# Patient Record
Sex: Female | Born: 1960 | Race: White | Hispanic: No | Marital: Married | Smoking: Never smoker
Health system: Southern US, Community
[De-identification: ages and names within clinical notes are randomized; demographics above are authoritative.]

## PROBLEM LIST (undated history)

## (undated) DIAGNOSIS — R609 Edema, unspecified: Secondary | ICD-10-CM

## (undated) DIAGNOSIS — Z8669 Personal history of other diseases of the nervous system and sense organs: Secondary | ICD-10-CM

## (undated) DIAGNOSIS — E559 Vitamin D deficiency, unspecified: Secondary | ICD-10-CM

## (undated) DIAGNOSIS — E538 Deficiency of other specified B group vitamins: Secondary | ICD-10-CM

## (undated) DIAGNOSIS — R238 Other skin changes: Secondary | ICD-10-CM

## (undated) DIAGNOSIS — M1990 Unspecified osteoarthritis, unspecified site: Secondary | ICD-10-CM

## (undated) DIAGNOSIS — Z9289 Personal history of other medical treatment: Secondary | ICD-10-CM

## (undated) DIAGNOSIS — K219 Gastro-esophageal reflux disease without esophagitis: Secondary | ICD-10-CM

## (undated) DIAGNOSIS — Z8709 Personal history of other diseases of the respiratory system: Secondary | ICD-10-CM

## (undated) DIAGNOSIS — E119 Type 2 diabetes mellitus without complications: Secondary | ICD-10-CM

## (undated) DIAGNOSIS — F419 Anxiety disorder, unspecified: Secondary | ICD-10-CM

## (undated) DIAGNOSIS — J189 Pneumonia, unspecified organism: Secondary | ICD-10-CM

## (undated) DIAGNOSIS — Z9889 Other specified postprocedural states: Secondary | ICD-10-CM

## (undated) DIAGNOSIS — G4700 Insomnia, unspecified: Secondary | ICD-10-CM

## (undated) DIAGNOSIS — E785 Hyperlipidemia, unspecified: Secondary | ICD-10-CM

## (undated) DIAGNOSIS — R112 Nausea with vomiting, unspecified: Secondary | ICD-10-CM

## (undated) DIAGNOSIS — I10 Essential (primary) hypertension: Secondary | ICD-10-CM

## (undated) DIAGNOSIS — R600 Localized edema: Secondary | ICD-10-CM

## (undated) DIAGNOSIS — K589 Irritable bowel syndrome without diarrhea: Secondary | ICD-10-CM

## (undated) DIAGNOSIS — R233 Spontaneous ecchymoses: Secondary | ICD-10-CM

## (undated) DIAGNOSIS — E531 Pyridoxine deficiency: Secondary | ICD-10-CM

## (undated) DIAGNOSIS — I219 Acute myocardial infarction, unspecified: Secondary | ICD-10-CM

## (undated) DIAGNOSIS — Z87440 Personal history of urinary (tract) infections: Secondary | ICD-10-CM

## (undated) DIAGNOSIS — I2510 Atherosclerotic heart disease of native coronary artery without angina pectoris: Secondary | ICD-10-CM

## (undated) DIAGNOSIS — J45909 Unspecified asthma, uncomplicated: Secondary | ICD-10-CM

## (undated) DIAGNOSIS — G8929 Other chronic pain: Secondary | ICD-10-CM

## (undated) DIAGNOSIS — M542 Cervicalgia: Secondary | ICD-10-CM

## (undated) DIAGNOSIS — R002 Palpitations: Secondary | ICD-10-CM

## (undated) DIAGNOSIS — M549 Dorsalgia, unspecified: Secondary | ICD-10-CM

## (undated) HISTORY — PX: ESOPHAGOGASTRODUODENOSCOPY: SHX1529

## (undated) HISTORY — PX: CHOLECYSTECTOMY: SHX55

## (undated) HISTORY — PX: PARTIAL HYSTERECTOMY: SHX80

## (undated) HISTORY — PX: DIAGNOSTIC LAPAROSCOPY: SUR761

## (undated) HISTORY — PX: NECK SURGERY: SHX720

## (undated) HISTORY — PX: DILATION AND CURETTAGE OF UTERUS: SHX78

## (undated) HISTORY — PX: OTHER SURGICAL HISTORY: SHX169

---

## 2001-05-02 ENCOUNTER — Encounter (INDEPENDENT_AMBULATORY_CARE_PROVIDER_SITE_OTHER): Payer: Self-pay

## 2001-05-02 ENCOUNTER — Encounter: Payer: Self-pay | Admitting: General Surgery

## 2001-05-02 ENCOUNTER — Encounter: Admission: RE | Admit: 2001-05-02 | Discharge: 2001-05-02 | Payer: Self-pay | Admitting: General Surgery

## 2001-06-25 DIAGNOSIS — I219 Acute myocardial infarction, unspecified: Secondary | ICD-10-CM

## 2001-06-25 HISTORY — DX: Acute myocardial infarction, unspecified: I21.9

## 2001-06-25 HISTORY — PX: CARDIAC CATHETERIZATION: SHX172

## 2001-12-22 ENCOUNTER — Encounter: Payer: Self-pay | Admitting: Family Medicine

## 2001-12-22 ENCOUNTER — Encounter: Admission: RE | Admit: 2001-12-22 | Discharge: 2001-12-22 | Payer: Self-pay | Admitting: Family Medicine

## 2003-03-16 ENCOUNTER — Other Ambulatory Visit: Admission: RE | Admit: 2003-03-16 | Discharge: 2003-03-16 | Payer: Self-pay | Admitting: Obstetrics and Gynecology

## 2003-03-23 ENCOUNTER — Encounter: Payer: Self-pay | Admitting: Obstetrics and Gynecology

## 2003-03-23 ENCOUNTER — Encounter: Admission: RE | Admit: 2003-03-23 | Discharge: 2003-03-23 | Payer: Self-pay | Admitting: Obstetrics and Gynecology

## 2003-09-13 ENCOUNTER — Ambulatory Visit (HOSPITAL_BASED_OUTPATIENT_CLINIC_OR_DEPARTMENT_OTHER): Admission: RE | Admit: 2003-09-13 | Discharge: 2003-09-13 | Payer: Self-pay | Admitting: Urology

## 2003-09-13 ENCOUNTER — Ambulatory Visit (HOSPITAL_COMMUNITY): Admission: RE | Admit: 2003-09-13 | Discharge: 2003-09-13 | Payer: Self-pay | Admitting: Urology

## 2004-04-18 ENCOUNTER — Other Ambulatory Visit: Admission: RE | Admit: 2004-04-18 | Discharge: 2004-04-18 | Payer: Self-pay | Admitting: Obstetrics and Gynecology

## 2004-06-09 ENCOUNTER — Observation Stay (HOSPITAL_COMMUNITY): Admission: AD | Admit: 2004-06-09 | Discharge: 2004-06-11 | Payer: Self-pay | Admitting: Obstetrics and Gynecology

## 2004-06-09 ENCOUNTER — Encounter (INDEPENDENT_AMBULATORY_CARE_PROVIDER_SITE_OTHER): Payer: Self-pay | Admitting: *Deleted

## 2004-06-09 ENCOUNTER — Ambulatory Visit (HOSPITAL_COMMUNITY): Admission: RE | Admit: 2004-06-09 | Discharge: 2004-06-09 | Payer: Self-pay | Admitting: Physical Therapy

## 2005-05-03 ENCOUNTER — Other Ambulatory Visit: Admission: RE | Admit: 2005-05-03 | Discharge: 2005-05-03 | Payer: Self-pay | Admitting: Obstetrics and Gynecology

## 2005-05-31 ENCOUNTER — Observation Stay (HOSPITAL_COMMUNITY): Admission: RE | Admit: 2005-05-31 | Discharge: 2005-06-01 | Payer: Self-pay | Admitting: Obstetrics and Gynecology

## 2005-05-31 ENCOUNTER — Encounter (INDEPENDENT_AMBULATORY_CARE_PROVIDER_SITE_OTHER): Payer: Self-pay | Admitting: Specialist

## 2005-09-08 ENCOUNTER — Encounter: Admission: RE | Admit: 2005-09-08 | Discharge: 2005-09-08 | Payer: Self-pay | Admitting: Obstetrics and Gynecology

## 2006-05-06 ENCOUNTER — Ambulatory Visit (HOSPITAL_COMMUNITY): Admission: RE | Admit: 2006-05-06 | Discharge: 2006-05-07 | Payer: Self-pay | Admitting: Neurosurgery

## 2006-05-28 ENCOUNTER — Encounter: Admission: RE | Admit: 2006-05-28 | Discharge: 2006-05-28 | Payer: Self-pay | Admitting: Family Medicine

## 2007-06-09 ENCOUNTER — Encounter: Admission: RE | Admit: 2007-06-09 | Discharge: 2007-06-09 | Payer: Self-pay | Admitting: Family Medicine

## 2008-06-09 ENCOUNTER — Encounter: Admission: RE | Admit: 2008-06-09 | Discharge: 2008-06-09 | Payer: Self-pay | Admitting: Obstetrics and Gynecology

## 2009-06-20 ENCOUNTER — Encounter: Admission: RE | Admit: 2009-06-20 | Discharge: 2009-06-20 | Payer: Self-pay | Admitting: Obstetrics and Gynecology

## 2010-01-02 ENCOUNTER — Inpatient Hospital Stay (HOSPITAL_COMMUNITY)
Admission: RE | Admit: 2010-01-02 | Discharge: 2010-01-02 | Payer: Self-pay | Source: Home / Self Care | Admitting: Neurosurgery

## 2010-02-23 ENCOUNTER — Encounter: Admission: RE | Admit: 2010-02-23 | Discharge: 2010-02-23 | Payer: Self-pay | Admitting: Neurosurgery

## 2010-07-14 ENCOUNTER — Encounter
Admission: RE | Admit: 2010-07-14 | Discharge: 2010-07-14 | Payer: Self-pay | Source: Home / Self Care | Attending: Obstetrics and Gynecology | Admitting: Obstetrics and Gynecology

## 2010-09-10 LAB — CBC
HCT: 43.5 % (ref 36.0–46.0)
MCHC: 34.5 g/dL (ref 30.0–36.0)
MCV: 96.9 fL (ref 78.0–100.0)
WBC: 5 10*3/uL (ref 4.0–10.5)

## 2010-09-10 LAB — DIFFERENTIAL
Basophils Absolute: 0 10*3/uL (ref 0.0–0.1)
Eosinophils Absolute: 0.1 10*3/uL (ref 0.0–0.7)
Eosinophils Relative: 2 % (ref 0–5)
Lymphocytes Relative: 30 % (ref 12–46)
Neutrophils Relative %: 61 % (ref 43–77)

## 2010-09-10 LAB — BASIC METABOLIC PANEL
Calcium: 10.1 mg/dL (ref 8.4–10.5)
Chloride: 104 mEq/L (ref 96–112)
Creatinine, Ser: 0.86 mg/dL (ref 0.4–1.2)
GFR calc Af Amer: >60 mL/min (ref >60)
GFR calc non Af Amer: >60 mL/min (ref >60)
Glucose, Bld: 110 mg/dL — ABNORMAL HIGH (ref 70–99)
Potassium: 5 mEq/L (ref 3.5–5.1)
Sodium: 141 mEq/L (ref 135–145)

## 2010-09-10 LAB — TYPE AND SCREEN

## 2010-11-10 NOTE — Op Note (Signed)
NAMESHUREE, BROSSART               ACCOUNT NO.:  [id number]   MEDICAL RECORD NO.:  [id number]          PATIENT TYPE:  OBV   LOCATION:  9399                          FACILITY:  [facility]   PHYSICIAN:  Juluis Mire, M.D.   DATE OF BIRTH:  May 03, 1961   DATE OF PROCEDURE:  05/31/2005  DATE OF DISCHARGE:                                 OPERATIVE REPORT   PREOPERATIVE DIAGNOSES:  1.  Pelvic endometriosis.  2.  Pelvic adhesions.   POSTOPERATIVE DIAGNOSES:  1.  Pelvic endometriosis.  2.  Pelvic adhesions.   OPERATIVE PROCEDURE:  1.  Lysis of adhesions.  2.  Laparoscopically-assisted vaginal hysterectomy.   SURGEON:  Juluis Mire, M.D.   ASSISTANT:  Zelphia Cairo, M.D.   ESTIMATED BLOOD LOSS:  600 mL.   PACKS AND DRAINS:  None.   INTRAOPERATIVE BLOOD REPLACED:  None.   COMPLICATIONS:  None.   INDICATIONS:  Dictated in the history and physical.   PROCEDURE:  The patient was taken to the OR and placed in the supine  position.  After a satisfactory level of general endotracheal anesthesia  obtained, the patient was placed in the dorsal lithotomy position using the  Allen stirrups.  The abdomen, perineum and vagina were prepped out with  Betadine.  A Hulka tenaculum was put in place.  The bladder was emptied by  in-and-out catheterization.  The patient was then draped in a sterile field.  A subumbilical incision made with a knife, extended through the subcutaneous  tissue.  The fascia was extended sharply and the incision in the fascia  extended laterally.  The peritoneum was identified and entered using blunt  pressure.  The Taut open laparoscopic trocar was put in place and secured.  The laparoscope was introduced.  There was no evidence of injury to adjacent  organs.  A 5 mm trocar was put in place in the suprapubic area under direct  visualization.  The uterus was enlarged.  She did have some cul-de-sac  adhesions that were taken down.  The left and right ovary appeared to  be  normal.  There was some thickening in the left round ligament and left  uterosacral area from endometriosis.  We first went to the right adnexa,  where using the Gyrus bipolar the right utero-ovarian pedicle was  cauterized, incised, the right tube and mesosalpinx was cauterized, incised,  the right round ligament was cauterized, incised, and the right broad  ligament was developed.  Next the left utero-ovarian pedicle was cauterized,  incised, the left tube and mesosalpinx was cauterized, incised, and the left  round ligament was cauterized, incised and extended into the broad ligament.  At this point in time we had good freeing of the adnexa.  The decision was  to go vaginally.  The abdomen was deflated of its carbon dioxide, the  laparoscope was removed.   The Hulka tenaculum was then removed, the patient's legs were repositioned.  A weighted speculum was placed in the vaginal vault, the cervix grasped with  a Jacobs tenaculum and the cul-de-sac was entered sharply.  Both uterosacral  ligaments  were clamped, cut, and suture ligated with 0 Vicryl.  The  reflection of the vaginal mucosa anteriorly was incised.  The bladder was  dissected superiorly.  The paracervical tissue was clamped, cut, and suture  ligated with 0 Vicryl.  The vesicouterine space was entered and a retractor  was put in place, no evidence of bladder injury.  Using the clamp, cut and  tie technique with suture ligatures of 0 Vicryl, the parametrium was  serially separated from the sides of uterus.  The uterus was then flipped  and removed.  We had some brisk bleeding from the left pelvic sidewall.  We  used a figure-of-eight of 0 Vicryl.  It continued to bleed.  The incision  was to go back laparoscopically.  At this point in time the vaginal mucosa  was reapproximated in a vertical fashion with interrupted figure-of-eights  of 0 Vicryl.  The Foley was placed to straight drain with retrieval of an  adequate amount  of clear urine.  A sponge on a sponge stick was placed in  the vaginal vault.   The patient's legs were repositioned.  The weighted speculum had been  removed.  The abdomen was reinflated of its carbon dioxide.  The laparoscope  was reintroduced.  Using suction irrigation, we identified an arterial  pumper along the left vaginal cuff, and it was brought under control with  the Gyrus bipolar.  At this point we had good hemostasis.  There was some  minimal vaginal cuff ooze, and this was brought under control with the  Gyrus.  We did notice a Ray-Tec in the cul-de-sac.  We removed that through  the subumbilical incision.  We reintroduced the laparoscope, visualizing the  vaginal cuff and both ovaries, and we had good hemostasis.  Total blood loss  was 600 mL.  The patient remained stable.  At this point in time the abdomen  was deflated of its carbon dioxide, all trocars removed, subumbilical fascia  closed with two figure-of-eights of 0 Vicryl, skin with interrupted  subcuticulars of 4-0 Vicryl, the suprapubic incision closed with Dermabond.  The sponge on a sponge stick was removed from the vaginal vault.  Urine  output remained clear and adequate.  The patient was taken out of the dorsal  lithotomy position and once alert extubated and transferred to the recovery  room in good condition.  Sponge, instrument and needle count reported as  correct by the circulating nurse x2.      Juluis Mire, M.D.  Electronically Signed     JSM/MEDQ  D:  05/31/2005  T:  05/31/2005  Job:  [id number]

## 2010-11-10 NOTE — H&P (Signed)
Michelle Archer, Michelle Archer               ACCOUNT NO.:  [id number]   MEDICAL RECORD NO.:  [id number]          PATIENT TYPE:  AMB   LOCATION:  SDC                           FACILITY:  [facility]   PHYSICIAN:  Juluis Mire, M.D.   DATE OF BIRTH:  Jun 22, 1961   DATE OF ADMISSION:  05/31/2005  DATE OF DISCHARGE:                                HISTORY & PHYSICAL   Patient is a 50 year old gravida 2, para 2-0-0-4 female presents for  laparoscopic-assisted vaginal hysterectomy.  Husband has had a prior  vasectomy.  Cycles are regular.  She has three days of flow, one day being  extremely heavy with increasing pain and discomfort.  She is using Darvocet  as needed for pain.  During intercourse she has pain during deep  penetration.  She had a previous diagnostic laparoscopy in 2005 with  findings of some pelvic adhesions and evidence of endometriosis.  The pain  with intercourse is becoming extremely limiting.  She has had urologic  procedures including TVT.  Dr. Retta Diones has evaluated her for potential  causes related to the bladder and felt that it was unrelated.  She now  presents for laparoscopic-assisted vaginal hysterectomy.   ALLERGIES:  KEFLEX.   MEDICATIONS:  Lisinopril, Protonix, Flexeril, Naprosyn, Fioricet, Valium.   PAST MEDICAL HISTORY:  She has history of atherosclerotic cerebrovascular  disease.  Has had a cardiac catheterization done in the past.  Presently, no  cardiac symptoms.  Being actively managed for hypertension.   PAST SURGICAL HISTORY:  1.  She had, as noted above, previous diagnostic laparoscopy last year.  2.  She had a TVT.  3.  Has also had neck surgery in 1983.  4.  Foot surgery in the past.   OBSTETRICAL HISTORY:  She has had vaginal delivery in 1986 of a singleton  and then she had a cesarean section in 1989 for triplets.   FAMILY HISTORY:  Noncontributory.   SOCIAL HISTORY:  No tobacco or alcohol use.   REVIEW OF SYSTEMS:  Noncontributory.   PHYSICAL  EXAMINATION:  VITAL SIGNS:  Patient is afebrile with stable vital  signs.  HEENT:  Patient normocephalic.  Pupils are equal, round, and reactive to  light and accommodation.  Extraocular movements were intact.  Sclerae and  conjunctivae clear.  Oropharynx clear.  NECK:  Without thyromegaly.  BREASTS:  No discrete masses.  LUNGS:  Clear.  CARDIAC:  Regular rhythm and rate with no murmurs or gallops.  ABDOMEN:  Benign.  No mass, organomegaly, or tenderness.  PELVIC:  Normal external genitalia.  Vaginal mucosa clear.  Cervix  unremarkable.  Uterus upper limits of normal size, moderately tender.  Adnexa unremarkable.  EXTREMITIES:  Trace edema.  NEUROLOGIC:  Grossly within normal limits.   IMPRESSION:  Pelvic pain and menorrhagia secondary to uterine adenomyosis  and/or pelvic endometriosis.   PLAN:  The patient will undergo laparoscopic-assisted vaginal hysterectomy.  Per patient's request ovaries will be conserved.  Potential risk of  transformation and malignancy have been discussed.  The risks of surgery  have been explained including the risk of infection, the risk  of infection  that could require transfusion, risk of AIDS or hepatitis, risk of injury to  adjacent organs including bladder, bowel, or ureters that could require  further exploratory surgery, risk of deep venous thrombosis and pulmonary  embolus.  Patient expressed understanding of indications and risks.      Juluis Mire, M.D.  Electronically Signed     JSM/MEDQ  D:  05/31/2005  T:  05/31/2005  Job:  [id number]

## 2010-11-10 NOTE — Op Note (Signed)
Michelle Archer, Michelle Archer               ACCOUNT NO.:  [id number]   MEDICAL RECORD NO.:  [id number]          PATIENT TYPE:  OIB   LOCATION:  3172                         FACILITY:  [facility]   PHYSICIAN:  Kathaleen Maser. Pool, M.D.    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   PROCEDURE:  C4-5, C5-6 and C6-7 anterior cervical diskectomy with fusion,  allograft and plating.   SURGEON:  Kathaleen Maser. Pool, M.D.   ASSISTANT:  Tia Alert, MD   ANESTHESIA:  General orotracheal.   INDICATIONS FOR PROCEDURE:  This is a 50 year old female with history of  neck and bilateral upper extremity symptoms consistent with cervical  myelopathy.  Workup demonstrates evidence of marked multilevel stenosis  secondary to C4-5, C5-6 and C6-7 central disk herniation with associated  spondylosis.  The patient has been counseled as to her options.  She decided  to proceed with a three-level anterior cervical diskectomy and fusion with  allograft and plating.   DESCRIPTION OF PROCEDURE:  The patient was brought to the operating room,  placed on the table in supine position.  After adequate level of anesthesia  achieved, the patient was supine with the neck slightly extended and held in  place with halter traction.  The patient's anterior cervical regions prepped  and draped free.  A 10 blade was used to make a linear skin incision  overlying the C5-6 vertebral level.  This was carried down sharply to the  platysma.  The platysma was then divided vertically and dissection proceeded  along the medial border of sternomastoid muscle and carotid sheath.  The  trachea and esophagus were mobilized and tracked towards the left.  Prevertebral fascia stripped off the anterior spinal column.  Longus  colli  muscles were elevated bilaterally using electrocautery.  Deep self retractor  was placed.  Intraoperative fluoroscopy was used at C4-5, C5-6 and C6-7  levels were confirmed.  Disk space at all three levels was then incised with  a 15 blade, retracted the fascia, the disk space cleaning achieved using  pituitary rongeurs, forward and backward biting Cloward curets, Kerrison  rongeurs and  high-speed drill.  All elements of the disks were removed down  to the level of the posterior annulus at all three levels.  Microscope was  then brought into the field and used throughout the remainder of the  diskectomy.  Remaining aspects of annulus and osteophytes removed using high-  speed drill down to the posterior longitudinal ligament.  The posterior  longitudinal ligament was then elevated and resected in piecemeal fashion  using Kerrison rongeurs to the level of the thecal sac which was then  identified.  Wide central decompression was then performed by undercutting  the bodies of C4  and C5.  Decompression then proceeded out each neural  foramen.  Wide anterior foraminotomies were then performed along the course  of the exiting nerve roots.  At this point a very thorough decompression was  achieved at C4-5.  The procedure was then repeated at C5-6 and C6-7 again  without complication.  The wound was then irrigated with antibiotic  solution.  Hemostasis was achieved.  A 6 mm __________ allograft wedge was  then impacted into place and recessed approximately 1 mL from the anterior  cortical margin at C4-5, C5-6 at C6-7.  A 62 mm Atlantis anterior cervical  plate was then placed over the C4, C5, C6 and C7 levels.  This was attached  under fluoroscopic guidance using 13 mm variable screws, two each at all  four levels.  Locking screws engaged.  Final images revealed good position  of bone grafts __________ spine.  The wound was then irrigated with  antibiotic solution. Hemostasis was assured  with bipolar electrocautery.  The wounds were then closed in typical fashion.  Steri-Strips and sterile  dressings were applied.  There were no operative complications.  The patient  tolerated the procedure well and she returned to recovery room for  postoperative care.           ______________________________  Kathaleen Maser. Pool, M.D.     HAP/MEDQ  D:  05/06/2006  T:  05/06/2006  Job:  [id number]

## 2010-11-10 NOTE — Discharge Summary (Signed)
Michelle Archer, Michelle Archer               ACCOUNT NO.:  [id number]   MEDICAL RECORD NO.:  [id number]          PATIENT TYPE:  OBV   LOCATION:  9306                          FACILITY:  [facility]   PHYSICIAN:  Juluis Mire, M.D.   DATE OF BIRTH:  02-Dec-1960   DATE OF ADMISSION:  05/31/2005  DATE OF DISCHARGE:  06/01/2005                                 DISCHARGE SUMMARY   ADMISSION DIAGNOSES:  Pelvic endometriosis/uterine adenomyosis.   DISCHARGE DIAGNOSES:  1.  Pelvic endometriosis/uterine adenomyosis.  2.  Pelvic adhesions.   PROCEDURE:  Laparoscopically assisted vaginal hysterectomy.   HISTORY OF PRESENT ILLNESS:  For complete history and physical, please see  dictated noted.   HOSPITAL COURSE:  The patient underwent laparoscopically assisted vaginal  hysterectomy.  Postoperative she did well. Postoperative hemoglobin 9.4.  patient was discharged home on first postoperative day.  At that time she  was afebrile with stable vital signs.  She was tolerating a regular diet and  ambulating without difficulty.  Her Foley catheter had been discontinued and  she was voiding without difficulty. Her abdominal examination was benign.  Incisions were clear. Bowel sounds were active.  She had no active vaginal  bleeding.   COMPLICATIONS:  In terms of complications none were encountered during her  stay in the hospital.   DISPOSITION:  Patient is discharged home in stable condition.  She is to  avoid vaginal entrance, heavy lifting or driving a car.  She is discharged  home on Percocet as she needs for pain.  Will resume all other medications.  She is to call with signs of infection, nausea, vomiting, increasing  abdominal pain, active bleeding or signs of phlebitis.   FOLLOW UP:  Follow up will be in the office in one week.      Juluis Mire, M.D.  Electronically Signed     JSM/MEDQ  D:  06/01/2005  T:  06/01/2005  Job:  [id number]

## 2010-11-10 NOTE — Op Note (Signed)
[name], [name]                           ACCOUNT NO.:  [id number]   MEDICAL RECORD NO.:  [id number]                   PATIENT TYPE:  AMB   LOCATION:  NESC                                 FACILITY:  [facility]   PHYSICIAN:  Bertram Millard. Dahlstedt, M.D.          DATE OF BIRTH:  07-Aug-1960   DATE OF PROCEDURE:  09/13/2003  DATE OF DISCHARGE:                                 OPERATIVE REPORT   PREOPERATIVE DIAGNOSIS:  Stress urinary incontinence.   POSTOPERATIVE DIAGNOSIS:  Stress urinary incontinence.   PROCEDURE:  Transobturator sling.   SURGEON:  Bertram Millard. Dahlstedt, M.D.   ANESTHESIA:  General with LMA.   COMPLICATIONS:  None.   BRIEF HISTORY:  Michelle Archer is a 50 year old lady who is sent by Dr. Arelia Sneddon for  evaluation and management of stress incontinence.  The patient has  significant incontinence with coughing, sneezing, laughing, and any sort of  exertional activity.  This includes intercourse.  She has had evaluations  before for this; and, at least 1 time in the past, had been scheduled for an  anti-incontinence procedure.  At this point, she has recently presented  desiring surgical management.   The patient has typical symptoms of stress incontinence.  She has a mild  cystocele which is not to be repaired at this time.  She is aware of the  risks and complications of repair.  She accepts these and desires to  proceed.   DESCRIPTION OF PROCEDURE:  The patient was admitted for preoperative IV  antibiotics and taken to the operating room where general anesthetic was  administered.  She was placed in the exaggerated lithotomy position.  Genitalia and perineum were prepped and draped.  A Foley catheter was placed  to drain her bladder.  The anterior vaginal submucosa was infiltrated with  1% lidocaine with epinephrine.  Two puncture wounds were made 5-cm lateral  to the clitoris, just over the lateral edge of the pubis bone.  A midline  vaginal incision was then made over the  midurethral area.  Flaps were raised  bilaterally.  The transobturator trocars were then placed bilaterally  through the pubic incisions and then guided, with a finger, through the  vaginal incision.   The ends of the tape were then grasped and brought through.  The tape was  cradled underneath the urethra with a fair amount of slack.  The catheter  was removed and the bladder and urethra were evaluated and found to be  normal.  The plastic covering over-the-top of the obturator tape was then  removed.  Just enough slack was produced underneath the urethra between the  tape and the urethra, at this point, using the right angle.  Approximately 1  cm of slack was left.  At this point the vaginal incision was closed with a  running 2-0 Vicryl.  The ends of the tape were cut just beneath the skin.  Skin edges were reapproximated using  the Dermabond.  Gentle pressure from  below on the bladder or above in the suprapubic area produced leakage at  this point.  Water was left in the bladder for a voiding trial later.   The patient tolerated the procedure well.  Estimated blood loss was 50-100  cc.  She was awakened and taken to the PACU in stable condition.  A vaginal  pack was left inside the patient's vagina which will be removed by the  patient in the morning.                                               Bertram Millard. Retta Diones, M.D.    SMD/MEDQ  D:  09/13/2003  T:  09/13/2003  Job:  [id number]   cc:   Juluis Mire, M.D.  [address] [place]  [place] [address]  Fax: [phone]

## 2010-11-10 NOTE — Op Note (Signed)
NAMEJAVAEH, MUSCATELLO               ACCOUNT NO.:  [id number]   MEDICAL RECORD NO.:  [id number]          PATIENT TYPE:  AMB   LOCATION:  SDC                           FACILITY:  [facility]   PHYSICIAN:  Dineen Kid. Rana Snare, M.D.    DATE OF BIRTH:  September 26, 1960   DATE OF PROCEDURE:  06/09/2004  DATE OF DISCHARGE:                                 OPERATIVE REPORT   PREOPERATIVE DIAGNOSIS:  Pelvic pain.   POSTOPERATIVE DIAGNOSES:  1.  Pelvic pain.  2.  Pelvic adhesions and endometriosis.   PROCEDURE:  Laparoscopy with lysis of adhesions, ablation of endometriosis  implants, and excision of pelvic nodule.   SURGEON:  Dineen Kid. Rana Snare, M.D.   ANESTHESIA:  General endotracheal.   ESTIMATED BLOOD LOSS:  Less than 10 cc.   INDICATIONS FOR PROCEDURE:  Michelle Archer is a 50 year old with worsening pelvic  pain and pressure, with a right lower quadrant dominance and a pulling  sensation.  She recently had a transvaginal tape.  She has been evaluated by  Dr. Retta Diones and was told that this should not be causing the pelvic  discomfort and does not appear to be related to this.  She desires more  definitive surgical evaluation and treatment of this and presents today for  laparoscopy and possible right salpingo-oophorectomy.  The risks and  benefits were discussed at length, which include but are not limited to the  risks of infection, bleeding, damage to uterus, tubes, and ovaries, bowel,  bladder, and that possibly this may not alleviate the pelvic pain or it can  recur.  Also, the risks associated with general anesthesia and the risks  associated with blood loss.  She gives her informed consent and wishes to  proceed.   FINDINGS:  Pelvic adhesions.  The left ovary was adhered to the left pelvic  sidewall.  The cul-de-sac had a moderate amount of pelvic adhesions from the  bowel to the uterosacral ligaments and the rectal fossa.  The right ovarian  fossa had multiple adhesions from the right ovary to the  pelvic sidewall and  omentum.  There was a 1-cm hemosiderin-laden nodule in the right portion of  the cul-de-sac and one small 2 to 3-mm endometriosis implants on the bladder  surface.  Otherwise, she did have a normal-appearing appendix and liver.  The uterus was otherwise normal.   DESCRIPTION OF PROCEDURE:  After adequate analgesia, the patient was placed  in the dorsal lithotomy position.  She was sterilely prepped and draped.  The bladder was thoroughly drained.   A Graves speculum was placed, and a Hulka tenaculum was placed on the  cervix.  A 1-cm infraumbilical skin incision was made.  A Veress needle was  inserted.  The abdomen was insufflated.  No dullness to percussion.  A 5-mm  trocar was inserted to the left of midline.  An 11-mm trocar was inserted  into the umbilicus.  The above findings were noted with the laparoscope.  Lysis of adhesions was carried out along the left pelvic sidewall with the  left ovary using bipolar cautery and Endoshears, freeing the  left ovary from  the pelvic sidewall.  The left ovarian fossa had several areas that were  suspicious for endometriosis which were ablated with bipolar cautery.  Endoshears were used to dissect the omentum and the bowel from the  uterosacral ligaments, and hemostasis was achieved with bipolar cautery.  The right cul-de-sac pelvic nodule was sharply dissected with Endoshears.  Hemostasis was achieved with bipolar cautery.  The nodule was sent for  pathology.  The adhesions from the right ovary to the cul-de-sac were  similarly excised with Endoshears and bipolar cautery used for good  hemostasis.  The endometriosis implant on the bladder was ablated with  bipolar cautery, with good thermal burn noted and the entire lesion  desiccated.  After reexamination of the pelvis and cul-de-sac, both ovaries  were now free and mobile.  The adhesions from the cul-de-sac were removed.  No evidence of residual dysplasia after ablation  were noted.  The trocars  were removed.  The abdomen was then desufflated.  The infraumbilical skin  incision was closed with a 0 Vicryl interrupted suture in the fascia, 3-0  Vicryl Rapide subcuticular suture.  The 5-mm site was closed with an  interrupted suture of 3-0 Vicryl Rapide.  There was good approximation and  good hemostasis.  The incisions were then injected with 0.25% Marcaine, 10  cc total used.  The tenaculum was removed from the cervix.  It was noted to  be hemostatic.  The sponge, needle, and instrument counts was normal x3.  The estimated blood loss was less than 10 cc.  The patient received 400 mg  of Cipro preoperatively and 30 mg of Toradol postoperatively.   DISPOSITION:  The patient will be discharged home to follow up in the office  in three weeks.  She was told to return for increased pain, fever, or  bleeding.  She was sent home with the routine instruction sheet for  laparoscopy.     [name]   DCL/MEDQ  D:  06/09/2004  T:  06/09/2004  Job:  [id number]

## 2011-06-12 ENCOUNTER — Other Ambulatory Visit: Payer: Self-pay | Admitting: Obstetrics and Gynecology

## 2011-06-12 DIAGNOSIS — Z1231 Encounter for screening mammogram for malignant neoplasm of breast: Secondary | ICD-10-CM

## 2011-07-17 ENCOUNTER — Ambulatory Visit
Admission: RE | Admit: 2011-07-17 | Discharge: 2011-07-17 | Disposition: A | Discharge status: Home / Self Care | Payer: 59 | Source: Ambulatory Visit | Attending: Obstetrics and Gynecology | Admitting: Obstetrics and Gynecology

## 2011-07-17 DIAGNOSIS — Z1231 Encounter for screening mammogram for malignant neoplasm of breast: Secondary | ICD-10-CM

## 2011-10-12 ENCOUNTER — Encounter (INDEPENDENT_AMBULATORY_CARE_PROVIDER_SITE_OTHER): Payer: 59 | Admitting: Ophthalmology

## 2011-10-12 DIAGNOSIS — E1165 Type 2 diabetes mellitus with hyperglycemia: Secondary | ICD-10-CM

## 2011-10-12 DIAGNOSIS — E1139 Type 2 diabetes mellitus with other diabetic ophthalmic complication: Secondary | ICD-10-CM

## 2011-10-12 DIAGNOSIS — H33009 Unspecified retinal detachment with retinal break, unspecified eye: Secondary | ICD-10-CM

## 2011-10-12 DIAGNOSIS — I10 Essential (primary) hypertension: Secondary | ICD-10-CM

## 2011-10-12 DIAGNOSIS — H2510 Age-related nuclear cataract, unspecified eye: Secondary | ICD-10-CM

## 2011-10-12 DIAGNOSIS — H43819 Vitreous degeneration, unspecified eye: Secondary | ICD-10-CM

## 2011-10-12 DIAGNOSIS — H35039 Hypertensive retinopathy, unspecified eye: Secondary | ICD-10-CM

## 2012-04-16 ENCOUNTER — Other Ambulatory Visit: Payer: Self-pay | Admitting: Neurosurgery

## 2012-04-22 ENCOUNTER — Encounter (HOSPITAL_COMMUNITY): Payer: Self-pay | Admitting: Pharmacy Technician

## 2012-04-24 ENCOUNTER — Other Ambulatory Visit (HOSPITAL_COMMUNITY): Payer: 59

## 2012-04-29 NOTE — Pre-Procedure Instructions (Signed)
20 Michelle Archer  04/29/2012   Your procedure is scheduled on:  Mon, Nov 11 @ 7:30 AM  Report to [facility] at 5:30 AM.  Call this number if you have problems the morning of surgery: [phone]   Remember:   Do not eat food:After Midnight.    Take these medicines the morning of surgery with A SIP OF WATER: Albuterol<Bring Your Inhaler With You>,Diazepam(Valium),Pain Pill(if needed), and Pantoprazole(Protonix)   Do not wear jewelry, make-up or nail polish.  Do not wear lotions, powders, or perfumes. You may wear deodorant.  Do not shave 48 hours prior to surgery.   Do not bring valuables to the hospital.  Contacts, dentures or bridgework may not be worn into surgery.  Leave suitcase in the car. After surgery it may be brought to your room.  For patients admitted to the hospital, checkout time is 11:00 AM the day of discharge.   Patients discharged the day of surgery will not be allowed to drive home.    Special Instructions: Shower using CHG 2 nights before surgery and the night before surgery.  If you shower the day of surgery use CHG.  Use special wash - you have one bottle of CHG for all showers.  You should use approximately 1/3 of the bottle for each shower.   Please read over the following fact sheets that you were given: Pain Booklet, Coughing and Deep Breathing, Blood Transfusion Information, MRSA Information and Surgical Site Infection Prevention

## 2012-04-30 ENCOUNTER — Encounter (HOSPITAL_COMMUNITY)
Admission: RE | Admit: 2012-04-30 | Discharge: 2012-04-30 | Disposition: A | Discharge status: Home / Self Care | Payer: 59 | Source: Ambulatory Visit | Attending: Neurosurgery | Admitting: Neurosurgery

## 2012-04-30 ENCOUNTER — Encounter (HOSPITAL_COMMUNITY): Payer: Self-pay

## 2012-04-30 HISTORY — DX: Personal history of other diseases of the nervous system and sense organs: Z86.69

## 2012-04-30 HISTORY — DX: Other specified postprocedural states: Z98.890

## 2012-04-30 HISTORY — DX: Irritable bowel syndrome, unspecified: K58.9

## 2012-04-30 HISTORY — DX: Personal history of other medical treatment: Z92.89

## 2012-04-30 HISTORY — DX: Insomnia, unspecified: G47.00

## 2012-04-30 HISTORY — DX: Other chronic pain: G89.29

## 2012-04-30 HISTORY — DX: Type 2 diabetes mellitus without complications: E11.9

## 2012-04-30 HISTORY — DX: Unspecified osteoarthritis, unspecified site: M19.90

## 2012-04-30 HISTORY — DX: Anxiety disorder, unspecified: F41.9

## 2012-04-30 HISTORY — DX: Essential (primary) hypertension: I10

## 2012-04-30 HISTORY — DX: Acute myocardial infarction, unspecified: I21.9

## 2012-04-30 HISTORY — DX: Cervicalgia: M54.2

## 2012-04-30 HISTORY — DX: Deficiency of other specified B group vitamins: E53.8

## 2012-04-30 HISTORY — DX: Pyridoxine deficiency: E53.1

## 2012-04-30 HISTORY — DX: Spontaneous ecchymoses: R23.3

## 2012-04-30 HISTORY — DX: Edema, unspecified: R60.9

## 2012-04-30 HISTORY — DX: Atherosclerotic heart disease of native coronary artery without angina pectoris: I25.10

## 2012-04-30 HISTORY — DX: Palpitations: R00.2

## 2012-04-30 HISTORY — DX: Gastro-esophageal reflux disease without esophagitis: K21.9

## 2012-04-30 HISTORY — DX: Personal history of urinary (tract) infections: Z87.440

## 2012-04-30 HISTORY — DX: Nausea with vomiting, unspecified: R11.2

## 2012-04-30 HISTORY — DX: Hyperlipidemia, unspecified: E78.5

## 2012-04-30 HISTORY — DX: Unspecified asthma, uncomplicated: J45.909

## 2012-04-30 HISTORY — DX: Other skin changes: R23.8

## 2012-04-30 HISTORY — DX: Localized edema: R60.0

## 2012-04-30 HISTORY — DX: Personal history of other diseases of the respiratory system: Z87.09

## 2012-04-30 HISTORY — DX: Pneumonia, unspecified organism: J18.9

## 2012-04-30 HISTORY — DX: Dorsalgia, unspecified: M54.9

## 2012-04-30 HISTORY — DX: Vitamin D deficiency, unspecified: E55.9

## 2012-04-30 LAB — CBC WITH DIFFERENTIAL/PLATELET
Basophils Absolute: 0 10*3/uL (ref 0.0–0.1)
Basophils Relative: 0 % (ref 0–1)
Eosinophils Absolute: 0.1 10*3/uL (ref 0.0–0.7)
Hemoglobin: 13.7 g/dL (ref 12.0–15.0)
MCH: 32 pg (ref 26.0–34.0)
MCHC: 33.7 g/dL (ref 30.0–36.0)
Monocytes Absolute: 0.4 10*3/uL (ref 0.1–1.0)
Monocytes Relative: 7 % (ref 3–12)
Neutro Abs: 3 10*3/uL (ref 1.7–7.7)
Neutrophils Relative %: 52 % (ref 43–77)
RDW: 12.3 % (ref 11.5–15.5)

## 2012-04-30 LAB — BASIC METABOLIC PANEL
Calcium: 9.6 mg/dL (ref 8.4–10.5)
Creatinine, Ser: 0.78 mg/dL (ref 0.50–1.10)
GFR calc non Af Amer: >90 mL/min (ref >90)
Sodium: 139 mEq/L (ref 135–145)

## 2012-04-30 LAB — TYPE AND SCREEN: Antibody Screen: NEGATIVE

## 2012-04-30 LAB — SURGICAL PCR SCREEN
MRSA, PCR: NEGATIVE
Staphylococcus aureus: POSITIVE — AB

## 2012-04-30 NOTE — Progress Notes (Signed)
[facility]-Dr.Preli [phone] visit last year-to request report  Stress test and echo done last yr-to request report from Dr.Preli  Heart cath in 2003  Medical Dr.Suzanne [place] in [place]  [phone]  EKG and CXR >1 yr ago

## 2012-05-04 MED ORDER — DEXAMETHASONE SODIUM PHOSPHATE 10 MG/ML IJ SOLN
10.0000 mg | INTRAMUSCULAR | Status: AC
  Administered 2012-05-05: 10 mg via INTRAVENOUS
  Filled 2012-05-04: qty 1

## 2012-05-05 ENCOUNTER — Ambulatory Visit (HOSPITAL_COMMUNITY): Payer: 59 | Admitting: Anesthesiology

## 2012-05-05 ENCOUNTER — Ambulatory Visit (HOSPITAL_COMMUNITY): Payer: 59

## 2012-05-05 ENCOUNTER — Encounter (HOSPITAL_COMMUNITY)
Admission: RE | Disposition: A | Discharge status: Home / Self Care | Payer: Self-pay | Source: Ambulatory Visit | Attending: Neurosurgery

## 2012-05-05 ENCOUNTER — Encounter (HOSPITAL_COMMUNITY): Payer: Self-pay | Admitting: *Deleted

## 2012-05-05 ENCOUNTER — Encounter (HOSPITAL_COMMUNITY): Payer: Self-pay | Admitting: Neurosurgery

## 2012-05-05 ENCOUNTER — Ambulatory Visit (HOSPITAL_COMMUNITY)
Admission: RE | Admit: 2012-05-05 | Discharge: 2012-05-06 | Disposition: A | Discharge status: Home / Self Care | Payer: 59 | Source: Ambulatory Visit | Attending: Neurosurgery | Admitting: Neurosurgery

## 2012-05-05 ENCOUNTER — Encounter (HOSPITAL_COMMUNITY): Payer: Self-pay | Admitting: Anesthesiology

## 2012-05-05 DIAGNOSIS — E119 Type 2 diabetes mellitus without complications: Secondary | ICD-10-CM | POA: Insufficient documentation

## 2012-05-05 DIAGNOSIS — M47817 Spondylosis without myelopathy or radiculopathy, lumbosacral region: Secondary | ICD-10-CM | POA: Insufficient documentation

## 2012-05-05 DIAGNOSIS — I2510 Atherosclerotic heart disease of native coronary artery without angina pectoris: Secondary | ICD-10-CM | POA: Insufficient documentation

## 2012-05-05 DIAGNOSIS — I10 Essential (primary) hypertension: Secondary | ICD-10-CM | POA: Insufficient documentation

## 2012-05-05 DIAGNOSIS — Z01810 Encounter for preprocedural cardiovascular examination: Secondary | ICD-10-CM | POA: Insufficient documentation

## 2012-05-05 DIAGNOSIS — J45909 Unspecified asthma, uncomplicated: Secondary | ICD-10-CM | POA: Insufficient documentation

## 2012-05-05 DIAGNOSIS — Z01812 Encounter for preprocedural laboratory examination: Secondary | ICD-10-CM | POA: Insufficient documentation

## 2012-05-05 DIAGNOSIS — M5126 Other intervertebral disc displacement, lumbar region: Secondary | ICD-10-CM | POA: Insufficient documentation

## 2012-05-05 DIAGNOSIS — Z01818 Encounter for other preprocedural examination: Secondary | ICD-10-CM | POA: Insufficient documentation

## 2012-05-05 DIAGNOSIS — M48062 Spinal stenosis, lumbar region with neurogenic claudication: Secondary | ICD-10-CM | POA: Diagnosis present

## 2012-05-05 HISTORY — PX: LUMBAR LAMINECTOMY/DECOMPRESSION MICRODISCECTOMY: SHX5026

## 2012-05-05 LAB — GLUCOSE, CAPILLARY
Glucose-Capillary: 118 mg/dL — ABNORMAL HIGH (ref 70–99)
Glucose-Capillary: 146 mg/dL — ABNORMAL HIGH (ref 70–99)
Glucose-Capillary: 178 mg/dL — ABNORMAL HIGH (ref 70–99)

## 2012-05-05 SURGERY — LUMBAR LAMINECTOMY/DECOMPRESSION MICRODISCECTOMY 2 LEVELS
Anesthesia: General | Site: Back | Laterality: Right | Wound class: Clean

## 2012-05-05 MED ORDER — HYDROCODONE-ACETAMINOPHEN 5-325 MG PO TABS: 1.0000 | ORAL_TABLET | ORAL | Status: DC | PRN

## 2012-05-05 MED ORDER — HYDROMORPHONE HCL PF 1 MG/ML IJ SOLN: 0.5000 mg | INTRAMUSCULAR | Status: DC | PRN

## 2012-05-05 MED ORDER — CEFAZOLIN SODIUM 1-5 GM-% IV SOLN
INTRAVENOUS | Status: AC
  Filled 2012-05-05: qty 50

## 2012-05-05 MED ORDER — PROMETHAZINE HCL 25 MG/ML IJ SOLN: 6.2500 mg | INTRAMUSCULAR | Status: DC | PRN

## 2012-05-05 MED ORDER — SODIUM CHLORIDE 0.9 % IR SOLN
Status: DC | PRN
  Administered 2012-05-05: 08:00:00

## 2012-05-05 MED ORDER — PHENOL 1.4 % MT LIQD: 1.0000 | OROMUCOSAL | Status: DC | PRN

## 2012-05-05 MED ORDER — CYCLOBENZAPRINE HCL 10 MG PO TABS: 10.0000 mg | ORAL_TABLET | Freq: Three times a day (TID) | ORAL | Status: DC | PRN

## 2012-05-05 MED ORDER — BUTALBITAL-APAP-CAFFEINE 50-325-40 MG PO TABS: 2.0000 | ORAL_TABLET | ORAL | Status: DC | PRN

## 2012-05-05 MED ORDER — THROMBIN 5000 UNITS EX SOLR
OROMUCOSAL | Status: DC | PRN
  Administered 2012-05-05: 08:00:00 via TOPICAL

## 2012-05-05 MED ORDER — BACITRACIN 50000 UNITS IM SOLR
INTRAMUSCULAR | Status: AC
  Filled 2012-05-05: qty 1

## 2012-05-05 MED ORDER — MENTHOL 3 MG MT LOZG: 1.0000 | LOZENGE | OROMUCOSAL | Status: DC | PRN

## 2012-05-05 MED ORDER — MEPERIDINE HCL 25 MG/ML IJ SOLN: 6.2500 mg | INTRAMUSCULAR | Status: DC | PRN

## 2012-05-05 MED ORDER — ROCURONIUM BROMIDE 100 MG/10ML IV SOLN
INTRAVENOUS | Status: DC | PRN
  Administered 2012-05-05: 10 mg via INTRAVENOUS
  Administered 2012-05-05: 30 mg via INTRAVENOUS
  Administered 2012-05-05 (×2): 50 mg via INTRAVENOUS

## 2012-05-05 MED ORDER — PANTOPRAZOLE SODIUM 40 MG PO TBEC
40.0000 mg | DELAYED_RELEASE_TABLET | Freq: Every day | ORAL | Status: DC
  Administered 2012-05-06: 40 mg via ORAL
  Filled 2012-05-05: qty 2

## 2012-05-05 MED ORDER — FENTANYL CITRATE 0.05 MG/ML IJ SOLN
INTRAMUSCULAR | Status: DC | PRN
  Administered 2012-05-05 (×2): 100 ug via INTRAVENOUS
  Administered 2012-05-05: 50 ug via INTRAVENOUS

## 2012-05-05 MED ORDER — MIDAZOLAM HCL 5 MG/5ML IJ SOLN
INTRAMUSCULAR | Status: DC | PRN
  Administered 2012-05-05: 2 mg via INTRAVENOUS

## 2012-05-05 MED ORDER — ALUM & MAG HYDROXIDE-SIMETH 200-200-20 MG/5ML PO SUSP: 30.0000 mL | Freq: Four times a day (QID) | ORAL | Status: DC | PRN

## 2012-05-05 MED ORDER — LACTATED RINGERS IV SOLN
INTRAVENOUS | Status: DC | PRN
  Administered 2012-05-05 (×2): via INTRAVENOUS

## 2012-05-05 MED ORDER — ALBUTEROL SULFATE HFA 108 (90 BASE) MCG/ACT IN AERS: 1.0000 | INHALATION_SPRAY | RESPIRATORY_TRACT | Status: DC | PRN

## 2012-05-05 MED ORDER — OXYCODONE HCL 5 MG/5ML PO SOLN: 5.0000 mg | Freq: Once | ORAL | Status: DC | PRN

## 2012-05-05 MED ORDER — SODIUM CHLORIDE 0.9 % IJ SOLN: 3.0000 mL | INTRAMUSCULAR | Status: DC | PRN

## 2012-05-05 MED ORDER — LISINOPRIL 5 MG PO TABS
5.0000 mg | ORAL_TABLET | Freq: Every day | ORAL | Status: DC
  Administered 2012-05-05 – 2012-05-06 (×2): 5 mg via ORAL
  Filled 2012-05-05 (×2): qty 1

## 2012-05-05 MED ORDER — LIDOCAINE HCL (CARDIAC) 20 MG/ML IV SOLN
INTRAVENOUS | Status: DC | PRN
  Administered 2012-05-05: 90 mg via INTRAVENOUS

## 2012-05-05 MED ORDER — HYDROMORPHONE HCL PF 1 MG/ML IJ SOLN
INTRAMUSCULAR | Status: AC
  Administered 2012-05-05: 0.5 mg via INTRAVENOUS
  Filled 2012-05-05: qty 1

## 2012-05-05 MED ORDER — OXYCODONE-ACETAMINOPHEN 5-325 MG PO TABS: 1.0000 | ORAL_TABLET | ORAL | Status: DC | PRN

## 2012-05-05 MED ORDER — DIAZEPAM 5 MG PO TABS
10.0000 mg | ORAL_TABLET | ORAL | Status: DC | PRN
  Administered 2012-05-05 – 2012-05-06 (×5): 10 mg via ORAL
  Filled 2012-05-05 (×5): qty 2

## 2012-05-05 MED ORDER — SENNA 8.6 MG PO TABS
1.0000 | ORAL_TABLET | Freq: Two times a day (BID) | ORAL | Status: DC
  Administered 2012-05-05: 8.6 mg via ORAL
  Filled 2012-05-05 (×3): qty 1

## 2012-05-05 MED ORDER — VANCOMYCIN HCL 1000 MG IV SOLR
1000.0000 mg | Freq: Two times a day (BID) | INTRAVENOUS | Status: DC
  Administered 2012-05-05: 1000 mg via INTRAVENOUS

## 2012-05-05 MED ORDER — SUMATRIPTAN SUCCINATE 100 MG PO TABS
100.0000 mg | ORAL_TABLET | Freq: Once | ORAL | Status: DC
  Filled 2012-05-05: qty 1

## 2012-05-05 MED ORDER — BUPIVACAINE HCL (PF) 0.25 % IJ SOLN
INTRAMUSCULAR | Status: DC | PRN
  Administered 2012-05-05: 30 mL

## 2012-05-05 MED ORDER — GLYCOPYRROLATE 0.2 MG/ML IJ SOLN
INTRAMUSCULAR | Status: DC | PRN
  Administered 2012-05-05: 0.6 mg via INTRAVENOUS

## 2012-05-05 MED ORDER — CEFAZOLIN SODIUM-DEXTROSE 2-3 GM-% IV SOLR: 2.0000 g | INTRAVENOUS | Status: DC

## 2012-05-05 MED ORDER — PHENYLEPHRINE HCL 10 MG/ML IJ SOLN
INTRAMUSCULAR | Status: DC | PRN
  Administered 2012-05-05 (×5): 80 ug via INTRAVENOUS

## 2012-05-05 MED ORDER — HYDROCODONE-ACETAMINOPHEN 7.5-500 MG/15ML PO SOLN
15.0000 mL | ORAL | Status: DC | PRN
  Administered 2012-05-05 – 2012-05-06 (×5): 15 mL via ORAL
  Filled 2012-05-05 (×5): qty 15

## 2012-05-05 MED ORDER — ONDANSETRON HCL 4 MG/2ML IJ SOLN: 4.0000 mg | INTRAMUSCULAR | Status: DC | PRN

## 2012-05-05 MED ORDER — SODIUM CHLORIDE 0.9 % IV SOLN: 250.0000 mL | INTRAVENOUS | Status: DC

## 2012-05-05 MED ORDER — VANCOMYCIN HCL IN DEXTROSE 1-5 GM/200ML-% IV SOLN
1000.0000 mg | Freq: Once | INTRAVENOUS | Status: AC
  Administered 2012-05-05: 1000 mg via INTRAVENOUS
  Filled 2012-05-05: qty 200

## 2012-05-05 MED ORDER — HYDROMORPHONE HCL PF 1 MG/ML IJ SOLN
0.2500 mg | INTRAMUSCULAR | Status: DC | PRN
  Administered 2012-05-05 (×2): 0.5 mg via INTRAVENOUS

## 2012-05-05 MED ORDER — CEFAZOLIN SODIUM-DEXTROSE 2-3 GM-% IV SOLR
INTRAVENOUS | Status: AC
  Filled 2012-05-05: qty 50

## 2012-05-05 MED ORDER — ACETAMINOPHEN 325 MG PO TABS: 650.0000 mg | ORAL_TABLET | ORAL | Status: DC | PRN

## 2012-05-05 MED ORDER — SODIUM CHLORIDE 0.9 % IJ SOLN
3.0000 mL | Freq: Two times a day (BID) | INTRAMUSCULAR | Status: DC
  Administered 2012-05-05: 3 mL via INTRAVENOUS

## 2012-05-05 MED ORDER — ACETAMINOPHEN 650 MG RE SUPP: 650.0000 mg | RECTAL | Status: DC | PRN

## 2012-05-05 MED ORDER — HYDROCHLOROTHIAZIDE 25 MG PO TABS
25.0000 mg | ORAL_TABLET | Freq: Every day | ORAL | Status: DC
  Filled 2012-05-05 (×2): qty 1

## 2012-05-05 MED ORDER — PROPOFOL 10 MG/ML IV BOLUS
INTRAVENOUS | Status: DC | PRN
  Administered 2012-05-05: 160 mg via INTRAVENOUS

## 2012-05-05 MED ORDER — DICYCLOMINE HCL 20 MG PO TABS
20.0000 mg | ORAL_TABLET | Freq: Four times a day (QID) | ORAL | Status: DC
  Filled 2012-05-05 (×8): qty 1

## 2012-05-05 MED ORDER — ONDANSETRON HCL 4 MG/2ML IJ SOLN
INTRAMUSCULAR | Status: DC | PRN
  Administered 2012-05-05: 4 mg via INTRAVENOUS

## 2012-05-05 MED ORDER — OXYCODONE HCL 5 MG PO TABS: 5.0000 mg | ORAL_TABLET | Freq: Once | ORAL | Status: DC | PRN

## 2012-05-05 MED ORDER — SODIUM CHLORIDE 0.9 % IV SOLN
INTRAVENOUS | Status: AC
  Filled 2012-05-05: qty 500

## 2012-05-05 MED ORDER — NEOSTIGMINE METHYLSULFATE 1 MG/ML IJ SOLN
INTRAMUSCULAR | Status: DC | PRN
  Administered 2012-05-05: 3 mg via INTRAVENOUS

## 2012-05-05 MED ORDER — KETOROLAC TROMETHAMINE 30 MG/ML IJ SOLN
30.0000 mg | Freq: Once | INTRAMUSCULAR | Status: AC
  Administered 2012-05-05: 30 mg via INTRAVENOUS
  Filled 2012-05-05: qty 1

## 2012-05-05 MED ORDER — ASPIRIN EC 81 MG PO TBEC
81.0000 mg | DELAYED_RELEASE_TABLET | Freq: Every day | ORAL | Status: DC
Start: 2012-05-05 — End: 2012-05-06
  Administered 2012-05-05 – 2012-05-06 (×2): 81 mg via ORAL
  Filled 2012-05-05 (×2): qty 1

## 2012-05-05 SURGICAL SUPPLY — 45 items
BAG DECANTER FOR FLEXI CONT (MISCELLANEOUS) ×2 IMPLANT
BENZOIN TINCTURE PRP APPL 2/3 (GAUZE/BANDAGES/DRESSINGS) ×2 IMPLANT
BLADE SURG ROTATE 9660 (MISCELLANEOUS) IMPLANT
BRUSH SCRUB EZ PLAIN DRY (MISCELLANEOUS) ×2 IMPLANT
BUR CUTTER 7.0 ROUND (BURR) ×2 IMPLANT
CANISTER SUCTION 2500CC (MISCELLANEOUS) ×2 IMPLANT
CLOTH BEACON ORANGE TIMEOUT ST (SAFETY) ×2 IMPLANT
CONT SPEC 4OZ CLIKSEAL STRL BL (MISCELLANEOUS) ×2 IMPLANT
DECANTER SPIKE VIAL GLASS SM (MISCELLANEOUS) ×2 IMPLANT
DERMABOND ADVANCED (GAUZE/BANDAGES/DRESSINGS) ×1
DERMABOND ADVANCED .7 DNX12 (GAUZE/BANDAGES/DRESSINGS) ×1 IMPLANT
DRAPE LAPAROTOMY 100X72X124 (DRAPES) ×2 IMPLANT
DRAPE MICROSCOPE ZEISS OPMI (DRAPES) ×2 IMPLANT
DRAPE POUCH INSTRU U-SHP 10X18 (DRAPES) ×2 IMPLANT
DRAPE PROXIMA HALF (DRAPES) ×2 IMPLANT
DRAPE SURG 17X23 STRL (DRAPES) ×4 IMPLANT
ELECT REM PT RETURN 9FT ADLT (ELECTROSURGICAL) ×2
ELECTRODE REM PT RTRN 9FT ADLT (ELECTROSURGICAL) ×1 IMPLANT
GAUZE SPONGE 4X4 16PLY XRAY LF (GAUZE/BANDAGES/DRESSINGS) IMPLANT
GLOVE ECLIPSE 8.5 STRL (GLOVE) ×2 IMPLANT
GLOVE EXAM NITRILE LRG STRL (GLOVE) IMPLANT
GLOVE EXAM NITRILE MD LF STRL (GLOVE) ×2 IMPLANT
GLOVE EXAM NITRILE XL STR (GLOVE) IMPLANT
GLOVE EXAM NITRILE XS STR PU (GLOVE) IMPLANT
GOWN BRE IMP SLV AUR LG STRL (GOWN DISPOSABLE) IMPLANT
GOWN BRE IMP SLV AUR XL STRL (GOWN DISPOSABLE) ×2 IMPLANT
GOWN STRL REIN 2XL LVL4 (GOWN DISPOSABLE) ×4 IMPLANT
KIT BASIN OR (CUSTOM PROCEDURE TRAY) ×2 IMPLANT
KIT ROOM TURNOVER OR (KITS) ×2 IMPLANT
NEEDLE HYPO 22GX1.5 SAFETY (NEEDLE) ×2 IMPLANT
NEEDLE SPNL 22GX3.5 QUINCKE BK (NEEDLE) ×2 IMPLANT
NS IRRIG 1000ML POUR BTL (IV SOLUTION) ×2 IMPLANT
PACK LAMINECTOMY NEURO (CUSTOM PROCEDURE TRAY) ×2 IMPLANT
PAD ARMBOARD 7.5X6 YLW CONV (MISCELLANEOUS) ×6 IMPLANT
RUBBERBAND STERILE (MISCELLANEOUS) ×4 IMPLANT
SPONGE GAUZE 4X4 12PLY (GAUZE/BANDAGES/DRESSINGS) ×2 IMPLANT
SPONGE SURGIFOAM ABS GEL SZ50 (HEMOSTASIS) ×2 IMPLANT
STRIP CLOSURE SKIN 1/2X4 (GAUZE/BANDAGES/DRESSINGS) ×2 IMPLANT
SUT VIC AB 2-0 CT1 18 (SUTURE) ×2 IMPLANT
SUT VIC AB 3-0 SH 8-18 (SUTURE) ×2 IMPLANT
SYR 20ML ECCENTRIC (SYRINGE) ×2 IMPLANT
TAPE CLOTH SURG 4X10 WHT LF (GAUZE/BANDAGES/DRESSINGS) ×2 IMPLANT
TOWEL OR 17X24 6PK STRL BLUE (TOWEL DISPOSABLE) ×2 IMPLANT
TOWEL OR 17X26 10 PK STRL BLUE (TOWEL DISPOSABLE) ×2 IMPLANT
WATER STERILE IRR 1000ML POUR (IV SOLUTION) ×2 IMPLANT

## 2012-05-05 NOTE — Progress Notes (Addendum)
ANTIBIOTIC CONSULT NOTE - INITIAL  Pharmacy Consult:  Vancomycin Indication:  Surgical prophylaxis  Allergies  Allergen Reactions  . Cephalosporins Hives  . Other Nausea And Vomiting    Nycin drugs  . Simvastatin Other (See Comments)    ALL STATINS: cause leg pain  . Zetia (Ezetimibe) Other (See Comments)    Leg pain    Patient Measurements: Height: 5' 4.96" (165 cm) Weight: 214 lb 8.1 oz (97.3 kg) IBW/kg (Calculated) : 56.91   Vital Signs: Temp: 97.9 F (36.6 C) (11/11 1218) Temp src: Oral (11/11 1218) BP: 107/61 mmHg (11/11 1218) Pulse Rate: 74  (11/11 1218) Intake/Output from this shift: Total I/O In: 1440 [P.O.:240; I.V.:1200] Out: 350 [Blood:350]  Labs: No results found for this basename: WBC:3,HGB:3,PLT:3,LABCREA:3,CREATININE:3 in the last 72 hours Estimated Creatinine Clearance: 93.8 ml/min (by C-G formula based on Cr of 0.78). No results found for this basename: VANCOTROUGH:2,VANCOPEAK:2,VANCORANDOM:2,GENTTROUGH:2,GENTPEAK:2,GENTRANDOM:2,TOBRATROUGH:2,TOBRAPEAK:2,TOBRARND:2,AMIKACINPEAK:2,AMIKACINTROU:2,AMIKACIN:2, in the last 72 hours   Microbiology: Recent Results (from the past 720 hour(s))  SURGICAL PCR SCREEN     Status: Abnormal   Collection Time   04/30/12  8:27 AM      Component Value Range Status Comment   MRSA, PCR NEGATIVE  NEGATIVE Final    Staphylococcus aureus POSITIVE (*) NEGATIVE Final     Medical History: Past Medical History  Diagnosis Date  . PONV (postoperative nausea and vomiting)     pt states limited to neck movement  . Hypertension     takes Lisinopril daily  . Peripheral edema     takes HCTZ prn  . Hyperlipidemia     can't take any meds   . Coronary artery disease   . Myocardial infarction 2003  . Asthma   . Palpitations   . History of bronchitis     last time about 42yrs ago  . Pneumonia     hx of-last time 2 yrs ago  . History of migraine     last one 04/29/12 and took Zomig  . Arthritis     osteo  . Chronic back  pain     DDD  . Chronic neck pain     DDD  . Bruises easily   . Vitamin D deficiency   . Vitamin B6 deficiency   . B12 deficiency   . GERD (gastroesophageal reflux disease)     takes Protonix daily  . IBS (irritable bowel syndrome)     takes Bentyl prn  . History of bladder infections   . History of blood transfusion     last time in 03-2units;no reaction noted  . Diabetes mellitus without complication     prediabetic-diet controlled  . Anxiety     takes Valium prn  . Insomnia     d/t pain      Assessment: Michelle Archer admitted for lumbar laminectomy/decompression to receive one post-op vancomycin dose since patient does not have a drain.  Patient with good renal function.  Noted patient received pre-op dose at 0800 today.   Goal of Therapy:  Infection prevention    Plan:  - Vancomycin 1gm IV x 1 at 2000 - Pharmacy will sign off.  Thank you for the consult!     Jiyah Torpey D. Laney Potash, PharmD, BCPS Pager:  [phone] 05/05/2012, 12:32 PM

## 2012-05-05 NOTE — Anesthesia Preprocedure Evaluation (Signed)
Anesthesia Evaluation  Patient identified by MRN, date of birth, ID band Patient awake    History of Anesthesia Complications (+) PONV  Airway Mallampati: II  Neck ROM: Limited  Mouth opening: Limited Mouth Opening  Dental  (+) Teeth Intact   Pulmonary asthma ,  breath sounds clear to auscultation        Cardiovascular hypertension, + CAD and + Past MI Rhythm:Regular Rate:Normal     Neuro/Psych    GI/Hepatic GERD-  ,  Endo/Other  diabetes  Renal/GU      Musculoskeletal   Abdominal   Peds  Hematology negative hematology ROS (+)   Anesthesia Other Findings   Reproductive/Obstetrics                           Anesthesia Physical Anesthesia Plan  ASA: III  Anesthesia Plan: General   Post-op Pain Management:    Induction: Intravenous  Airway Management Planned: Oral ETT and Video Laryngoscope Planned  Additional Equipment:   Intra-op Plan:   Post-operative Plan: Extubation in OR  Informed Consent: I have reviewed the patients History and Physical, chart, labs and discussed the procedure including the risks, benefits and alternatives for the proposed anesthesia with the patient or authorized representative who has indicated his/her understanding and acceptance.   Dental advisory given  Plan Discussed with: CRNA and Surgeon  Anesthesia Plan Comments:         Anesthesia Quick Evaluation

## 2012-05-05 NOTE — Brief Op Note (Signed)
05/05/2012  10:22 AM  PATIENT:  Michelle Archer  51 y.o. female  PRE-OPERATIVE DIAGNOSIS:  HNP/stenosis  POST-OPERATIVE DIAGNOSIS:  HNP/stenosis  PROCEDURE:  Procedure(s) (LRB) with comments: LUMBAR LAMINECTOMY/DECOMPRESSION MICRODISCECTOMY 2 LEVELS (Right)  SURGEON:  Surgeon(s) and Role:    * Temple Pacini, MD - Primary    * Hewitt Shorts, MD - Assisting  PHYSICIAN ASSISTANT:   ASSISTANTS:    ANESTHESIA:   general  EBL:  Total I/O In: 1200 [I.V.:1200] Out: 350 [Blood:350]  BLOOD ADMINISTERED:none  DRAINS: none   LOCAL MEDICATIONS USED:  MARCAINE     SPECIMEN:  No Specimen  DISPOSITION OF SPECIMEN:  N/A  COUNTS:  YES  TOURNIQUET:  * No tourniquets in log *  DICTATION: .Dragon Dictation  PLAN OF CARE: Admit for overnight observation  PATIENT DISPOSITION:  PACU - hemodynamically stable.   Delay start of Pharmacological VTE agent (>24hrs) due to surgical blood loss or risk of bleeding: yes

## 2012-05-05 NOTE — Preoperative (Signed)
Beta Blockers   Reason not to administer Beta Blockers:Not Applicable 

## 2012-05-05 NOTE — Plan of Care (Signed)
Problem: Consults Goal: Diagnosis - Spinal Surgery Outcome: Completed/Met Date Met:  05/05/12 Lumbar Laminectomy (Complex)

## 2012-05-05 NOTE — Op Note (Signed)
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  L4-5 spondylosis with stenosis and left L4-5 herniated pulposus.  Postoperative diagnosis: Same  Procedure Name: Right L2-3 laminotomy and microdiscectomy  Bilateral L4-5 decompressive laminotomies with bilateral L4 and L5 decompressive foraminotomies.  Left L4-5 microdiscectomy  Surgeon:Tarvaris Puglia A.Neilani Duffee, M.D.  Asst. Surgeon: Newell Coral  Anesthesia: General  Indication: Old female with severe back and bilateral lower extremity symptoms right greater than left. Workup demonstrates evidence of a significant right-sided L2-3 disc herniation with stenosis and marked spondylosis and stenosis at L4-5 with an associated left paracentral disc herniation L4-5. Patient has failed conservative management and presents for right L2-3 laminotomy maxillectomy and bilateral L4-5 decompression and microdiscectomy.  Operative note: After induction anesthesia vision prone onto Wilson frame and appropriately padded. Lumbar region prepped and draped. Incision made overlying L2-3 and L4-5. Dissection performed bilaterally exposing the lamina and facet joints of L4 and L5 and on the right side at L2-3 exposing the lamina and facet joints of L2-3 on the right. Self-retaining retractor was replaced x-rays taken levels confirmed. Laminotomies then performed using high-speed drill and Kerrison rongeurs to remove the inferior aspect of lamina above medial aspect of the facet joint and the superior rim of the lamina below. Ligament flavum was elevated and resected piecemeal fashion and L3 locations. Underlying thecal sac and exiting nerve roots were notified. Starting first at L4-5 on the left side decompressive foraminotomies were then performed on course exiting L4 and L5 nerve roots. Epidural reflexes quite good and cut. Thecal sac and L5 nerve root gently mobilized  track towards midline. Paracentral disc herniation was identified. This is incised a 15 blade in a rectangular fashion. Wide this basically it achieved using pituitary rongeurs operative of rongeurs and Epstein curettes per almost the disc herniation completely resected. Attention redirected to the patient's right side. Foraminotomies along the L4 and L5 nerve roots were completed. The spaces inspected and found to be flat. There is no indication for discectomy on the right side at L4-5. Wound is irrigated out like solution Gelfoam placed topically for hemostasis. The patient and placed the L2-3 level. Thecal sac and L3 nerve root gently mobilized track towards midline. Disc herniation was readily apparent. This is incised 15 blade in a rectangular fashion. Y. disc space clear was achieved using pituitary rongeurs operative of rongeurs and Epstein curettes. All meds the disc herniation with resected. All loose are obvious he jammed his removed and interspace. This point a very thorough discectomy had been achieved. There was no evidence sac or nerve roots. Wound is then irrigated with and bike solution. Hemostasis achieved by poor carver was and close in layers with Vicryl sutures. Steri-Strips sterile dressing were applied. There were no apparent complications. Patient tolerated the procedure well and returned to recovery room postop.

## 2012-05-05 NOTE — Anesthesia Procedure Notes (Signed)
Procedure Name: Intubation Date/Time: 05/05/2012 7:59 AM Performed by: Sharlene Dory E Pre-anesthesia Checklist: Patient identified, Emergency Drugs available, Suction available, Patient being monitored and Timeout performed Patient Re-evaluated:Patient Re-evaluated prior to inductionOxygen Delivery Method: Circle system utilized Preoxygenation: Pre-oxygenation with 100% oxygen Intubation Type: IV induction Ventilation: Mask ventilation without difficulty Grade View: Grade II Tube type: Oral Tube size: 7.5 mm Number of attempts: 1 Airway Equipment and Method: Stylet and Video-laryngoscopy Placement Confirmation: ETT inserted through vocal cords under direct vision,  positive ETCO2 and breath sounds checked- equal and bilateral Secured at: 21 cm Tube secured with: Tape Dental Injury: Teeth and Oropharynx as per pre-operative assessment

## 2012-05-05 NOTE — Anesthesia Postprocedure Evaluation (Signed)
  Anesthesia Post-op Note  Patient: Michelle Archer  Procedure(s) Performed: Procedure(s) (LRB) with comments: LUMBAR LAMINECTOMY/DECOMPRESSION MICRODISCECTOMY 2 LEVELS (Right)  Patient Location: PACU  Anesthesia Type:General  Level of Consciousness: awake  Airway and Oxygen Therapy: Patient Spontanous Breathing  Post-op Pain: mild  Post-op Assessment: Post-op Vital signs reviewed  Post-op Vital Signs: stable  Complications: No apparent anesthesia complications

## 2012-05-05 NOTE — Transfer of Care (Signed)
Immediate Anesthesia Transfer of Care Note  Patient: Michelle Archer  Procedure(s) Performed: Procedure(s) (LRB) with comments: LUMBAR LAMINECTOMY/DECOMPRESSION MICRODISCECTOMY 2 LEVELS (Right)  Patient Location: PACU  Anesthesia Type:General  Level of Consciousness: awake, alert  and oriented  Airway & Oxygen Therapy: Patient Spontanous Breathing and Patient connected to nasal cannula oxygen  Post-op Assessment: Report given to PACU RN, Post -op Vital signs reviewed and stable and Patient moving all extremities X 4  Post vital signs: Reviewed and stable  Complications: No apparent anesthesia complications

## 2012-05-05 NOTE — H&P (Signed)
Michelle Archer is an 51 y.o. female.   Chief Complaint: Back and bilateral leg pain HPI: 51 year old female with severe back pain with radiation to both lower cavities right greater than left. Workup demonstrates evidence of number of problems with her lumbar spine including a right-sided L2-3 disc herniation with spinal stenosis and significant spondylosis and stenosis bilaterally at L4-5 secondary to a broad-based disc herniation and significant facet arthropathy coupled with ligamentous buckling. Patient's failed conservative management and presents now for right-sided L2-3 laminotomy and microdiscectomy and bilateral L4-5 laminotomies and possible microdiscectomy is.  Past Medical History  Diagnosis Date  . PONV (postoperative nausea and vomiting)     pt states limited to neck movement  . Hypertension     takes Lisinopril daily  . Peripheral edema     takes HCTZ prn  . Hyperlipidemia     can't take any meds   . Coronary artery disease   . Myocardial infarction 2003  . Asthma   . Palpitations   . History of bronchitis     last time about 44yrs ago  . Pneumonia     hx of-last time 2 yrs ago  . History of migraine     last one 04/29/12 and took Zomig  . Arthritis     osteo  . Chronic back pain     DDD  . Chronic neck pain     DDD  . Bruises easily   . Vitamin D deficiency   . Vitamin B6 deficiency   . B12 deficiency   . GERD (gastroesophageal reflux disease)     takes Protonix daily  . IBS (irritable bowel syndrome)     takes Bentyl prn  . History of bladder infections   . History of blood transfusion     last time in 03-2units;no reaction noted  . Diabetes mellitus without complication     prediabetic-diet controlled  . Anxiety     takes Valium prn  . Insomnia     d/t pain    Past Surgical History  Procedure Date  . Partial hysterectomy   . Neck surgery 2010/2011    x 2  . Diagnostic laparoscopy   . Left knee arthroscopy   . Cholecystectomy   . Cardiac  catheterization 2003    pt states she bleed out-was hospitalized x 3 days  . Right foot surgery     x 3  . Left foot surgery      x 2  . Urethra sling placed   . Dilation and curettage of uterus   . Esophagogastroduodenoscopy     History reviewed. No pertinent family history. Social History:  reports that she has never smoked. She does not have any smokeless tobacco history on file. She reports that she drinks alcohol. She reports that she does not use illicit drugs.  Allergies:  Allergies  Allergen Reactions  . Cephalosporins Hives  . Other Nausea And Vomiting    Nycin drugs  . Simvastatin Other (See Comments)    ALL STATINS: cause leg pain  . Zetia (Ezetimibe) Other (See Comments)    Leg pain    Medications Prior to Admission  Medication Sig Dispense Refill  . aspirin EC 81 MG tablet Take 81 mg by mouth daily.      . butalbital-acetaminophen-caffeine (FIORICET, ESGIC) 50-325-40 MG per tablet Take 2 tablets by mouth every 4 (four) hours as needed. For neck swelling      . Calcium-Vitamin D-Vitamin K (CALCIUM SOFT CHEWS PO) Take  1 tablet by mouth daily. Viactive Calcium Chews      . cyclobenzaprine (FLEXERIL) 10 MG tablet Take 10 mg by mouth every 4 (four) hours as needed. For muscle spasms      . diazepam (VALIUM) 10 MG tablet Take 10 mg by mouth every 4 (four) hours as needed. For anxiety      . dicyclomine (BENTYL) 20 MG tablet Take 20 mg by mouth every 6 (six) hours.      . hydrochlorothiazide (HYDRODIURIL) 25 MG tablet Take 25 mg by mouth daily as needed. For feet/anckle swelling      . HYDROcodone-acetaminophen (LORTAB) 7.5-500 MG/15ML solution Take 15 mLs by mouth every 4 (four) hours as needed. For pain      . ketorolac (TORADOL) 10 MG tablet Take 10-20 mg by mouth every 4 (four) hours as needed. 20 mg at onset of pain then take 10 mg every 4 hours as needed for pain      . lisinopril (PRINIVIL,ZESTRIL) 10 MG tablet Take 5 mg by mouth daily.      . naproxen (NAPROSYN)  500 MG tablet Take 500 mg by mouth every 6 (six) hours as needed. For pain      . oxyCODONE-acetaminophen (PERCOCET/ROXICET) 5-325 MG per tablet Take 1-2 tablets by mouth every 4 (four) hours as needed. For pain      . pantoprazole (PROTONIX) 40 MG tablet Take 40-80 mg by mouth daily. Start with 40 mg and will take additional 40 mg if needed      . zolmitriptan (ZOMIG) 5 MG tablet Take 5 mg by mouth as needed. For migraine      . albuterol (PROVENTIL HFA;VENTOLIN HFA) 108 (90 BASE) MCG/ACT inhaler Inhale 1-2 puffs into the lungs every 4 (four) hours as needed. Shortness of breath        Results for orders placed during the hospital encounter of 05/05/12 (from the past 48 hour(s))  GLUCOSE, CAPILLARY     Status: Abnormal   Collection Time   05/05/12  6:16 AM      Component Value Range Comment   Glucose-Capillary 118 (*) 70 - 99 mg/dL    No results found.  Review of Systems  Constitutional: Negative.   HENT: Negative.   Eyes: Negative.   Respiratory: Negative.   Cardiovascular: Negative.   Gastrointestinal: Negative.   Genitourinary: Negative.   Musculoskeletal: Negative.   Skin: Negative.   Neurological: Negative.   Endo/Heme/Allergies: Negative.   Psychiatric/Behavioral: Negative.     Blood pressure 122/72, pulse 83, temperature 98 F (36.7 C), temperature source Oral, resp. rate 18, SpO2 100.00%. Physical Exam  Constitutional: She is oriented to person, place, and time. She appears well-developed and well-nourished.  HENT:  Head: Normocephalic and atraumatic.  Right Ear: External ear normal.  Left Ear: External ear normal.  Nose: Nose normal.  Mouth/Throat: Oropharynx is clear and moist.  Eyes: Conjunctivae normal and EOM are normal. Pupils are equal, round, and reactive to light.  Neck: Normal range of motion. Neck supple. No tracheal deviation present. No thyromegaly present.  Cardiovascular: Normal rate, regular rhythm, normal heart sounds and intact distal pulses.   Exam reveals no friction rub.   No murmur heard. Respiratory: Effort normal and breath sounds normal. No respiratory distress. She has no wheezes.  GI: Soft. Bowel sounds are normal. She exhibits no distension. There is no tenderness.  Musculoskeletal: Normal range of motion. She exhibits no edema and no tenderness.  Neurological: She is alert and oriented to person,  place, and time. She has normal reflexes. She displays normal reflexes. No cranial nerve deficit. She exhibits normal muscle tone. Coordination normal.  Skin: Skin is warm and dry. No rash noted. No erythema. No pallor.  Psychiatric: She has a normal mood and affect. Her behavior is normal. Judgment and thought content normal.     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 L4-5 spondylosis with stenosis and associated left paracentral disc herniation. Plan right L2-3 laminotomy microdiscectomy and bilateral L4-5 laminotomies and foraminotomies with probable left L4-5 microdiscectomy. Risks and benefits been explained. Patient wishes to proceed.  Dacota Ruben A 05/05/2012, 7:39 AM

## 2012-05-06 ENCOUNTER — Encounter (HOSPITAL_COMMUNITY): Payer: Self-pay | Admitting: Neurosurgery

## 2012-05-06 LAB — GLUCOSE, CAPILLARY: Glucose-Capillary: 117 mg/dL — ABNORMAL HIGH (ref 70–99)

## 2012-05-06 MED ORDER — DIAZEPAM 10 MG PO TABS: 10.0000 mg | ORAL_TABLET | ORAL | Status: AC | PRN

## 2012-05-06 MED ORDER — OXYCODONE-ACETAMINOPHEN 5-325 MG PO TABS: 1.0000 | ORAL_TABLET | ORAL | Status: DC | PRN

## 2012-05-06 MED ORDER — PROMETHAZINE HCL 25 MG PO TABS
25.0000 mg | ORAL_TABLET | ORAL | Status: DC | PRN
  Administered 2012-05-06: 25 mg via ORAL
  Filled 2012-05-06: qty 1

## 2012-05-06 NOTE — Discharge Summary (Signed)
Physician Discharge Summary  Patient ID: Michelle Archer MRN: [id number] DOB/AGE: 1960-07-07 51 y.o.  Admit date: 05/05/2012 Discharge date: 05/06/2012  Admission Diagnoses:  Discharge Diagnoses:  Principal Problem:  *Lumbar stenosis with neurogenic claudication   Discharged Condition: good  Hospital Course: Admitted to the hospital where she underwent uncomplicated right L2-3 laminotomy and microdiscectomy and bilateral L4-5 decompressive foraminotomies. Postoperatively she is doing reasonably well. She has the expected amount of lumbar soreness. Her lower trimming function is intact. She is having no lower chamois pain. She is voiding well and tolerating regular diet. Ready discharge home.  Consults:   Significant Diagnostic Studies:   Treatments:   Discharge Exam: Blood pressure 94/68, pulse 88, temperature 97.6 F (36.4 C), temperature source Oral, resp. rate 18, height 5' 4.96" (1.65 m), weight 97.3 kg (214 lb 8.1 oz), SpO2 100.00%. She is awake and alert oriented and appropriate  Nerve function is intact. Motor and sensory function extremities normal. Chest and abdomen benign. Wound clean dry and intact.  Disposition:      Medication List     As of 05/06/2012 10:37 AM    TAKE these medications         albuterol 108 (90 BASE) MCG/ACT inhaler   Commonly known as: PROVENTIL HFA;VENTOLIN HFA   Inhale 1-2 puffs into the lungs every 4 (four) hours as needed. Shortness of breath      aspirin EC 81 MG tablet   Take 81 mg by mouth daily.      butalbital-acetaminophen-caffeine 50-325-40 MG per tablet   Commonly known as: FIORICET, ESGIC   Take 2 tablets by mouth every 4 (four) hours as needed. For neck swelling      CALCIUM SOFT CHEWS PO   Take 1 tablet by mouth daily. Viactive Calcium Chews      cyclobenzaprine 10 MG tablet   Commonly known as: FLEXERIL   Take 10 mg by mouth every 4 (four) hours as needed. For muscle spasms      diazepam 10 MG tablet   Commonly known as: VALIUM   Take 1 tablet (10 mg total) by mouth every 4 (four) hours as needed for anxiety (spasms).      diazepam 10 MG tablet   Commonly known as: VALIUM   Take 10 mg by mouth every 4 (four) hours as needed. For anxiety      dicyclomine 20 MG tablet   Commonly known as: BENTYL   Take 20 mg by mouth every 6 (six) hours.      hydrochlorothiazide 25 MG tablet   Commonly known as: HYDRODIURIL   Take 25 mg by mouth daily as needed. For feet/anckle swelling      HYDROcodone-acetaminophen 7.5-500 MG/15ML solution   Commonly known as: LORTAB   Take 15 mLs by mouth every 4 (four) hours as needed. For pain      ketorolac 10 MG tablet   Commonly known as: TORADOL   Take 10-20 mg by mouth every 4 (four) hours as needed. 20 mg at onset of pain then take 10 mg every 4 hours as needed for pain      lisinopril 10 MG tablet   Commonly known as: PRINIVIL,ZESTRIL   Take 5 mg by mouth daily.      naproxen 500 MG tablet   Commonly known as: NAPROSYN   Take 500 mg by mouth every 6 (six) hours as needed. For pain      oxyCODONE-acetaminophen 5-325 MG per tablet   Commonly known as: PERCOCET/ROXICET  Take 1-2 tablets by mouth every 4 (four) hours as needed. For pain      oxyCODONE-acetaminophen 5-325 MG per tablet   Commonly known as: PERCOCET/ROXICET   Take 1-2 tablets by mouth every 4 (four) hours as needed.      pantoprazole 40 MG tablet   Commonly known as: PROTONIX   Take 40-80 mg by mouth daily. Start with 40 mg and will take additional 40 mg if needed      zolmitriptan 5 MG tablet   Commonly known as: ZOMIG   Take 5 mg by mouth as needed. For migraine           Follow-up Information    Follow up with Demarko Zeimet A, MD. Call in 1 week. (Ask for Lurena Joiner)    Contact information:   [address] [place] [address] [phone]          Signed: Julio Sicks A 05/06/2012, 10:37 AM

## 2012-07-08 ENCOUNTER — Other Ambulatory Visit: Payer: Self-pay | Admitting: Obstetrics and Gynecology

## 2012-07-08 DIAGNOSIS — Z1231 Encounter for screening mammogram for malignant neoplasm of breast: Secondary | ICD-10-CM

## 2012-07-28 ENCOUNTER — Ambulatory Visit
Admission: RE | Admit: 2012-07-28 | Discharge: 2012-07-28 | Disposition: A | Discharge status: Home / Self Care | Payer: 59 | Source: Ambulatory Visit | Attending: Obstetrics and Gynecology | Admitting: Obstetrics and Gynecology

## 2012-07-28 DIAGNOSIS — Z1231 Encounter for screening mammogram for malignant neoplasm of breast: Secondary | ICD-10-CM

## 2013-07-01 ENCOUNTER — Other Ambulatory Visit: Payer: Self-pay | Admitting: Obstetrics and Gynecology

## 2013-07-01 DIAGNOSIS — Z1231 Encounter for screening mammogram for malignant neoplasm of breast: Secondary | ICD-10-CM

## 2013-07-30 ENCOUNTER — Ambulatory Visit (INDEPENDENT_AMBULATORY_CARE_PROVIDER_SITE_OTHER): Payer: 59

## 2013-07-30 DIAGNOSIS — Z1231 Encounter for screening mammogram for malignant neoplasm of breast: Secondary | ICD-10-CM

## 2014-01-27 IMAGING — CR DG CHEST 2V
2 series · 2 of 2 positions shown · non-contrast
Comparison: 12/27/2009

CLINICAL DATA: Back surgery.  Preop radiograph.

CHEST - 2 VIEW

[view not recorded (1 of 2)]
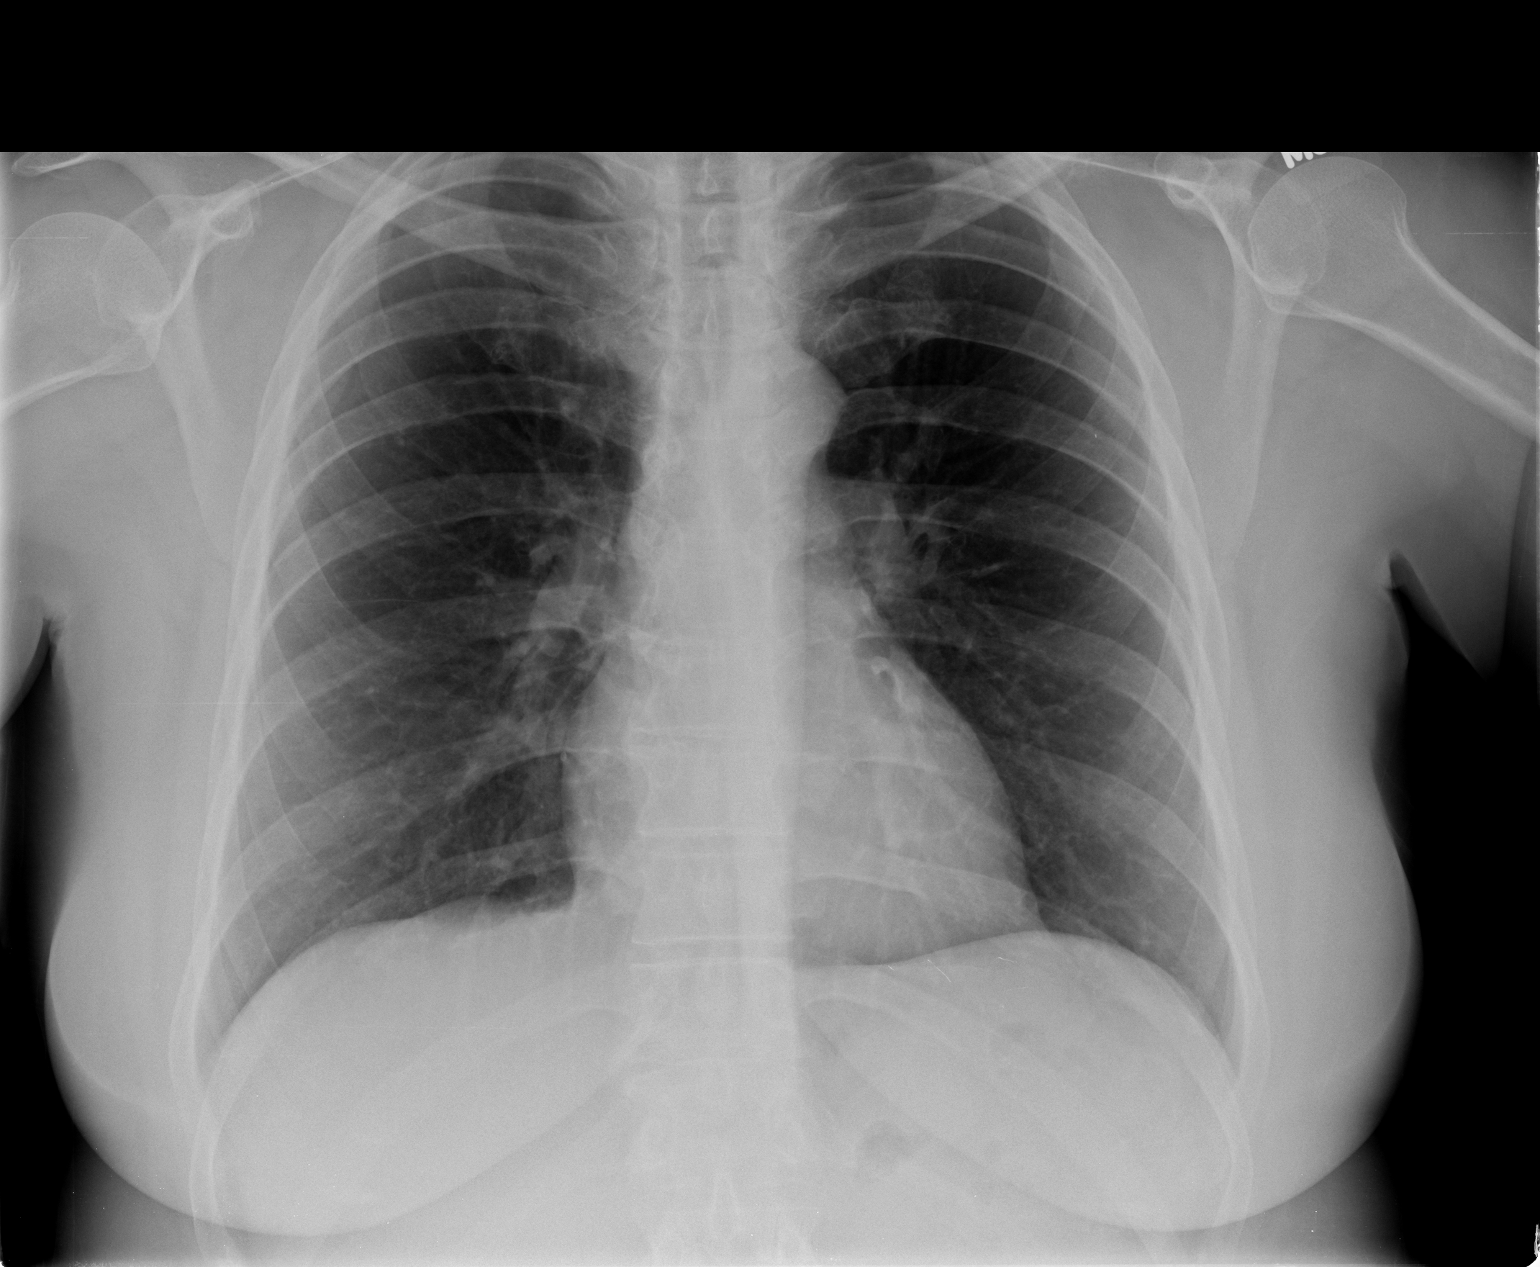

[view not recorded (2 of 2)]
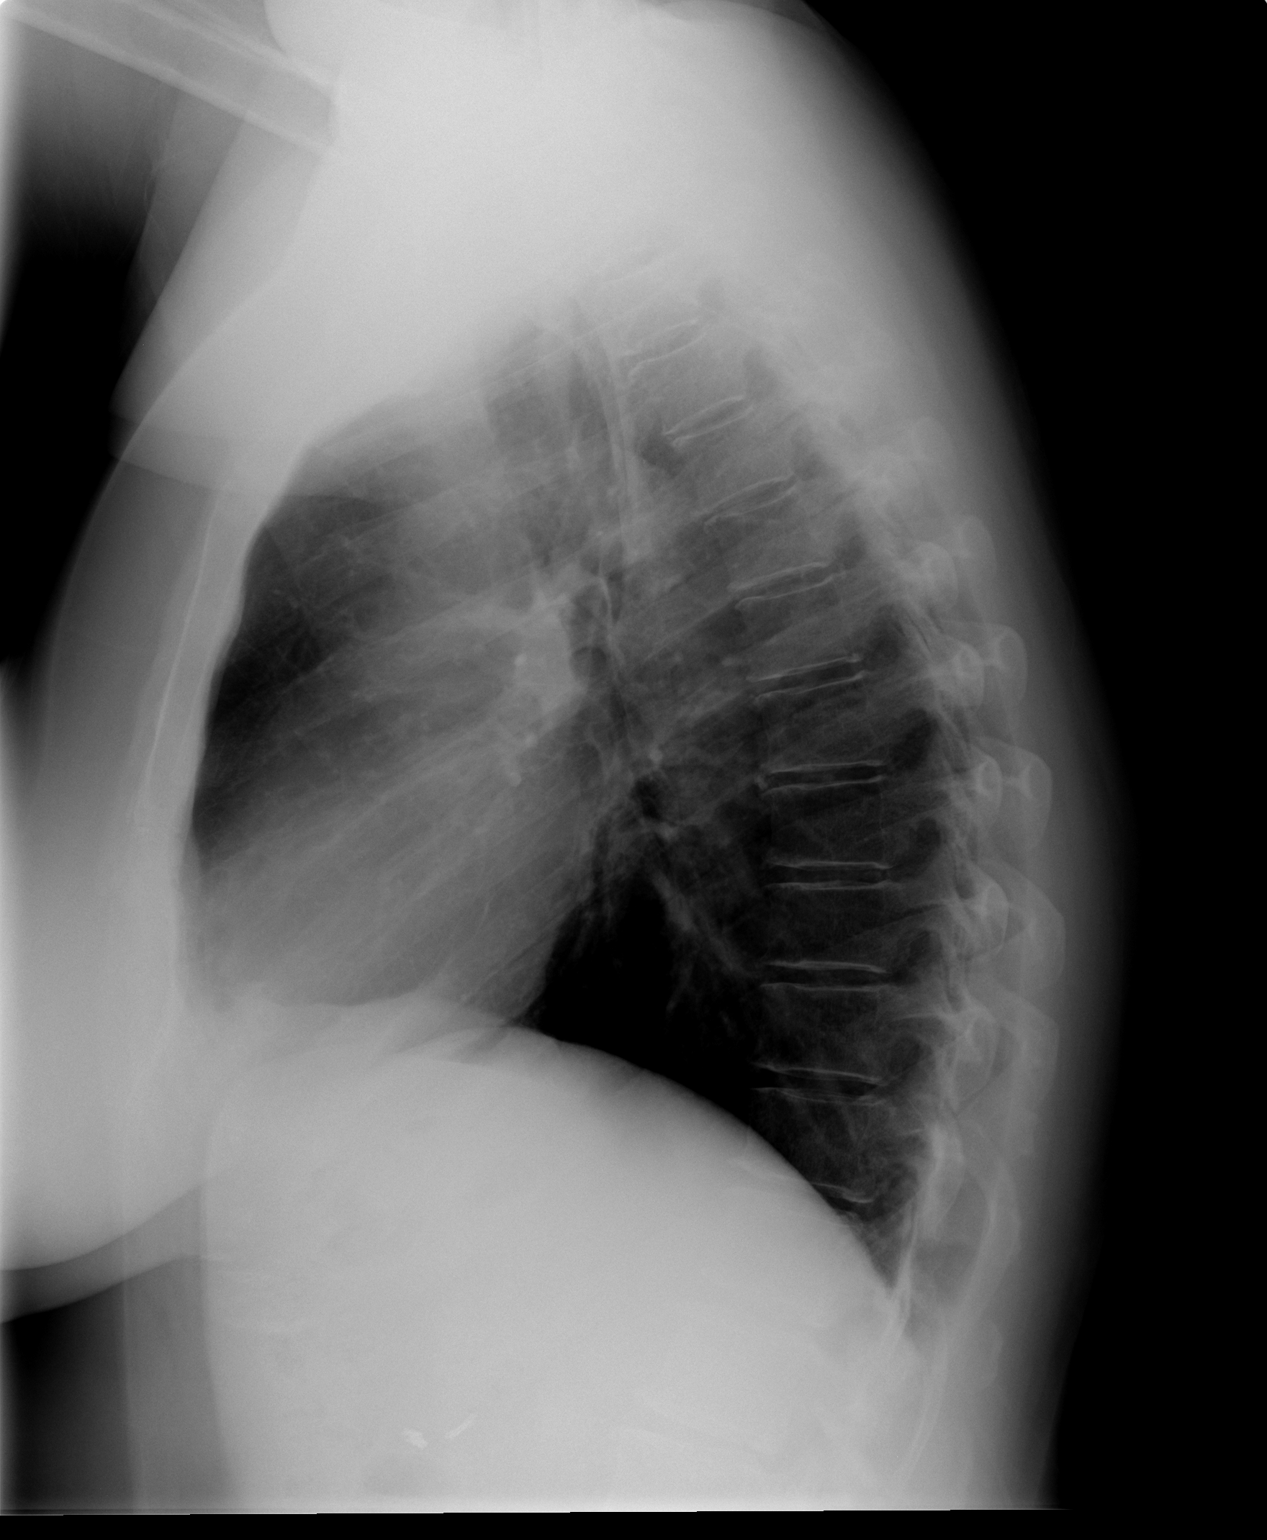

[2 of 2 positions shown; findings below may reference images not displayed]

FINDINGS: Heart size is normal.  No pleural effusion or edema.  No
airspace consolidation.
IMPRESSION: 1.  No acute cardiopulmonary abnormalities.

## 2014-08-24 ENCOUNTER — Other Ambulatory Visit: Payer: Self-pay

## 2014-08-24 DIAGNOSIS — Z1231 Encounter for screening mammogram for malignant neoplasm of breast: Secondary | ICD-10-CM

## 2014-08-30 ENCOUNTER — Ambulatory Visit
Admission: RE | Admit: 2014-08-30 | Discharge: 2014-08-30 | Disposition: A | Discharge status: Home / Self Care | Payer: Managed Care, Other (non HMO) | Source: Ambulatory Visit

## 2014-08-30 DIAGNOSIS — Z1231 Encounter for screening mammogram for malignant neoplasm of breast: Secondary | ICD-10-CM

## 2015-08-26 ENCOUNTER — Other Ambulatory Visit: Payer: Self-pay | Admitting: Obstetrics and Gynecology

## 2015-08-26 DIAGNOSIS — Z1231 Encounter for screening mammogram for malignant neoplasm of breast: Secondary | ICD-10-CM

## 2015-09-30 ENCOUNTER — Ambulatory Visit: Payer: Managed Care, Other (non HMO)

## 2015-10-14 ENCOUNTER — Ambulatory Visit (INDEPENDENT_AMBULATORY_CARE_PROVIDER_SITE_OTHER)

## 2015-10-14 DIAGNOSIS — Z1231 Encounter for screening mammogram for malignant neoplasm of breast: Secondary | ICD-10-CM

## 2016-10-19 ENCOUNTER — Other Ambulatory Visit: Payer: Self-pay | Admitting: Obstetrics and Gynecology

## 2016-10-19 DIAGNOSIS — Z1231 Encounter for screening mammogram for malignant neoplasm of breast: Secondary | ICD-10-CM

## 2016-11-13 ENCOUNTER — Ambulatory Visit (INDEPENDENT_AMBULATORY_CARE_PROVIDER_SITE_OTHER)

## 2016-11-13 ENCOUNTER — Ambulatory Visit: Payer: Managed Care, Other (non HMO)

## 2016-11-13 DIAGNOSIS — Z1231 Encounter for screening mammogram for malignant neoplasm of breast: Secondary | ICD-10-CM

## 2017-10-09 ENCOUNTER — Other Ambulatory Visit: Payer: Self-pay | Admitting: Obstetrics and Gynecology

## 2017-10-09 DIAGNOSIS — Z1239 Encounter for other screening for malignant neoplasm of breast: Secondary | ICD-10-CM

## 2017-11-13 ENCOUNTER — Ambulatory Visit (INDEPENDENT_AMBULATORY_CARE_PROVIDER_SITE_OTHER)

## 2017-11-13 DIAGNOSIS — Z1231 Encounter for screening mammogram for malignant neoplasm of breast: Secondary | ICD-10-CM | POA: Diagnosis not present

## 2017-11-13 DIAGNOSIS — Z1239 Encounter for other screening for malignant neoplasm of breast: Secondary | ICD-10-CM

## 2018-11-25 ENCOUNTER — Other Ambulatory Visit: Payer: Self-pay | Admitting: Obstetrics and Gynecology

## 2018-11-25 DIAGNOSIS — Z1231 Encounter for screening mammogram for malignant neoplasm of breast: Secondary | ICD-10-CM

## 2019-09-04 ENCOUNTER — Emergency Department (INDEPENDENT_AMBULATORY_CARE_PROVIDER_SITE_OTHER)
Admission: EM | Admit: 2019-09-04 | Discharge: 2019-09-04 | Disposition: A | Discharge status: Home / Self Care | Source: Home / Self Care | Attending: Family Medicine | Admitting: Family Medicine

## 2019-09-04 ENCOUNTER — Other Ambulatory Visit: Payer: Self-pay

## 2019-09-04 ENCOUNTER — Encounter: Payer: Self-pay | Admitting: Emergency Medicine

## 2019-09-04 DIAGNOSIS — J0101 Acute recurrent maxillary sinusitis: Secondary | ICD-10-CM | POA: Diagnosis not present

## 2019-09-04 DIAGNOSIS — E559 Vitamin D deficiency, unspecified: Secondary | ICD-10-CM

## 2019-09-04 MED ORDER — AMOXICILLIN 875 MG PO TABS: 875.0000 mg | ORAL_TABLET | Freq: Two times a day (BID) | ORAL | 0 refills | Status: DC

## 2019-09-04 MED ORDER — FLUCONAZOLE 150 MG PO TABS
150.0000 mg | ORAL_TABLET | Freq: Once | ORAL | 0 refills | Status: AC
Start: 2019-09-04 — End: 2019-09-04

## 2019-09-04 NOTE — ED Provider Notes (Signed)
[name]    CSN: [id number] Arrival date & time: 09/04/19  0818      History   Chief Complaint Chief Complaint  Patient presents with  . Facial Pain    HPI Michelle Archer is a 59 y.o. female.   Patient has seasonal rhinitis and a long history of recurrent sinusitis as a result of immunosuppression from her psoriasis meds.  She has had increased sinus congestion recently as a result of weather change.  Last night she developed right facial pain and pain in her right upper teeth.  She denies fevers, chills, and sweats.  She denies sore throat and cough.   She denies chest tightness, shortness of breath, and changes in taste/smell. Patient requests Rx for Diflucan with any Rx for antibiotic.  Review of records reveals that she has had Vitamin D deficiency in the past (no recent level checked since 05/03/17).     Past Medical History:  Diagnosis Date  . Anxiety    takes Valium prn  . Arthritis    osteo  . Asthma   . B12 deficiency   . Bruises easily   . Chronic back pain    DDD  . Chronic neck pain    DDD  . Coronary artery disease   . Diabetes mellitus without complication ([place])    prediabetic-diet controlled  . GERD (gastroesophageal reflux disease)    takes Protonix daily  . History of bladder infections   . History of blood transfusion    last time in 03-2units;no reaction noted  . History of bronchitis    last time about 43yr ago  . History of migraine    last one 04/29/12 and took Zomig  . Hyperlipidemia    can't take any meds   . Hypertension    takes Lisinopril daily  . IBS (irritable bowel syndrome)    takes Bentyl prn  . Insomnia    d/t pain  . [place]  . Palpitations   . Peripheral edema    takes HCTZ prn  . Pneumonia    hx of-last time 2 yrs ago  . PONV (postoperative nausea and vomiting)    pt states limited to neck movement  . Vitamin B6 deficiency   . Vitamin D deficiency     Patient Active  Problem List   Diagnosis Date Noted  . Lumbar stenosis with neurogenic claudication 05/05/2012    Past Surgical History:  Procedure Laterality Date  . CARDIAC CATHETERIZATION  2003   pt states she bleed out-was hospitalized x 3 days  . CHOLECYSTECTOMY    . DIAGNOSTIC LAPAROSCOPY    . DILATION AND CURETTAGE OF UTERUS    . ESOPHAGOGASTRODUODENOSCOPY    . left foot surgery      x 2  . left knee arthroscopy    . LUMBAR LAMINECTOMY/DECOMPRESSION MICRODISCECTOMY  05/05/2012   Procedure: LUMBAR LAMINECTOMY/DECOMPRESSION MICRODISCECTOMY 2 LEVELS;  Surgeon: HCharlie Pitter MD;  [facility];  Service: Neurosurgery;  Laterality: Right;  . NECK SURGERY  2010/2011   x 2  . PARTIAL HYSTERECTOMY    . right foot surgery     x 3  . urethra sling placed      OB History   No obstetric history on file.      Home Medications    Prior to Admission medications   Medication Sig Start Date End Date Taking? Authorizing Provider  Certolizumab Pegol (CIMZIA STARTER KIT) 6 X 200 MG/ML KIT  07/27/19  Yes [provider]  albuterol (PROVENTIL HFA;VENTOLIN HFA) 108 (90 BASE) MCG/ACT inhaler Inhale 1-2 puffs into the lungs every 4 (four) hours as needed. Shortness of breath    [provider]  amoxicillin (AMOXIL) 875 MG tablet Take 1 tablet (875 mg total) by mouth 2 (two) times daily. 09/04/19   Kandra Nicolas, MD  aspirin EC 81 MG tablet Take 81 mg by mouth daily.    [provider]  betamethasone dipropionate 0.05 % cream betamethasone dipropionate 0.05 % topical cream  APP THIN LAYER EXT AA QD    [provider]  butalbital-acetaminophen-caffeine (FIORICET, ESGIC) 50-325-40 MG per tablet Take 2 tablets by mouth every 4 (four) hours as needed. For neck swelling    [provider]  Calcium-Vitamin D-Vitamin K (CALCIUM SOFT CHEWS PO) Take 1 tablet by mouth daily. Viactive Calcium Chews    [provider]  cyclobenzaprine (FLEXERIL) 10 MG  tablet Take 10 mg by mouth every 4 (four) hours as needed. For muscle spasms    [provider]  diazepam (VALIUM) 10 MG tablet Take 10 mg by mouth every 4 (four) hours as needed. For anxiety    [provider]  diazepam (VALIUM) 10 MG tablet Take 1 tablet (10 mg total) by mouth every 4 (four) hours as needed for anxiety (spasms). 05/06/12   Earnie Larsson, MD  dicyclomine (BENTYL) 20 MG tablet Take 20 mg by mouth every 6 (six) hours.    [provider]  fluconazole (DIFLUCAN) 150 MG tablet Take 1 tablet (150 mg total) by mouth once for 1 dose. May repeat in 72 hours. 09/04/19 09/04/19  Kandra Nicolas, MD  hydrochlorothiazide (HYDRODIURIL) 25 MG tablet Take 25 mg by mouth daily as needed. For feet/anckle swelling    [provider]  HYDROcodone-acetaminophen (LORTAB) 7.5-500 MG/15ML solution Take 15 mLs by mouth every 4 (four) hours as needed. For pain    [provider]  ketorolac (TORADOL) 10 MG tablet Take 10-20 mg by mouth every 4 (four) hours as needed. 20 mg at onset of pain then take 10 mg every 4 hours as needed for pain    [provider]  lisinopril (PRINIVIL,ZESTRIL) 10 MG tablet Take 5 mg by mouth daily.    [provider]  naproxen (NAPROSYN) 500 MG tablet Take 500 mg by mouth every 6 (six) hours as needed. For pain    [provider]  oxyCODONE-acetaminophen (PERCOCET/ROXICET) 5-325 MG per tablet Take 1-2 tablets by mouth every 4 (four) hours as needed. For pain    [provider]  oxyCODONE-acetaminophen (PERCOCET/ROXICET) 5-325 MG per tablet Take 1-2 tablets by mouth every 4 (four) hours as needed. 05/06/12   Earnie Larsson, MD  pantoprazole (PROTONIX) 40 MG tablet Take 40-80 mg by mouth daily. Start with 40 mg and will take additional 40 mg if needed    [provider]  zolmitriptan (ZOMIG) 5 MG tablet Take 5 mg by mouth as needed. For migraine    [provider]    Family History History  reviewed. No pertinent family history.  Social History Social History   Tobacco Use  . Smoking status: Never Smoker  . Smokeless tobacco: Never Used  Substance Use Topics  . Alcohol use: Yes    Comment: red wine-its been a while  . Drug use: No     Allergies   Cephalosporins, Other, Simvastatin, and Zetia [ezetimibe]   Review of Systems Review of Systems No sore throat  No cough No pleuritic pain No wheezing + nasal congestion + post-nasal drainage + sinus pain/pressure No itchy/red eyes ? Earache, right No hemoptysis No SOB No fever/chills No nausea No vomiting No abdominal pain No diarrhea No urinary symptoms No skin rash No fatigue No myalgias No headache Used OTC meds (Mucinenex) without relief   Physical Exam Triage Vital Signs ED Triage Vitals  Enc Vitals Group     BP 09/04/19 0842 (!) 141/87     Pulse Rate 09/04/19 0842 88     Resp --      Temp 09/04/19 0842 (!) 97.5 F (36.4 C)     Temp Source 09/04/19 0842 Oral     SpO2 09/04/19 0842 98 %     Weight 09/04/19 0837 200 lb (90.7 kg)     Height --      Head Circumference --      Peak Flow --      Pain Score 09/04/19 0836 9     Pain Loc --      Pain Edu? --      Excl. in [place]? --    No data found.  Updated Vital Signs BP (!) 141/87 (BP Location: Right Arm)   Pulse 88   Temp (!) 97.5 F (36.4 C) (Oral)   Wt 90.7 kg   SpO2 98%   BMI 33.32 kg/m   Visual Acuity Right Eye Distance:   Left Eye Distance:   Bilateral Distance:    Right Eye Near:   Left Eye Near:    Bilateral Near:     Physical Exam Vitals and nursing note reviewed.  Constitutional:      General: She is not in acute distress. HENT:     Head: Normocephalic.     Comments: There is distinct tenderness to palpation over the right maxillary sinuses.    Right Ear: Tympanic membrane, ear canal and external ear normal.     Left Ear: Tympanic membrane, ear canal and external ear normal.     Nose: Congestion present.      Mouth/Throat:     Pharynx: Oropharynx is clear.  Eyes:     Extraocular Movements: Extraocular movements intact.     Conjunctiva/sclera: Conjunctivae normal.     Pupils: Pupils are equal, round, and reactive to light.  Cardiovascular:     Rate and Rhythm: Normal rate.     Heart sounds: Normal heart sounds.  Pulmonary:     Effort: Pulmonary effort is normal.     Breath sounds: Normal breath sounds.  Lymphadenopathy:     Cervical: No cervical adenopathy.  Skin:    General: Skin is warm and dry.  Neurological:     Mental Status: She is alert.       UC Treatments / Results  Labs (all labs ordered are listed, but only abnormal results are displayed) Labs Reviewed - No data to display  EKG   Radiology No results found.  Procedures Procedures (including critical care time)  Medications Ordered in UC Medications - No data to display  Initial Impression / Assessment and Plan / UC Course  I have reviewed the triage vital signs and the nursing notes.  Pertinent labs & imaging results that were available during my care of the patient were reviewed by me and considered in my medical decision making (see chart for details).    Begin amoxicillin '875mg'$  BID for 10 days (patient notes that she has had no adverse effects from amoxicillin).  Rx also for Diflucan  146m(2 doses). Recommend follow-up with PCP for repeat Vitamin D level and treatment   Final Clinical Impressions(s) / UC Diagnoses   Final diagnoses:  Acute recurrent maxillary sinusitis  Vitamin D deficiency     Discharge Instructions     Take plain guaifenesin ('1200mg'$  extended release tabs such as Mucinex) twice daily, with plenty of water, for congestion.  Get adequate rest.   May use Afrin nasal spray (or generic oxymetazoline) each morning for about 5 days and then discontinue.  Also recommend using saline nasal spray several times daily and saline nasal irrigation (AYR is a common brand).  Use Flonase nasal spray  each morning after using Afrin nasal spray and saline nasal irrigation.      ED Prescriptions    Medication Sig Dispense Auth. Provider   fluconazole (DIFLUCAN) 150 MG tablet Take 1 tablet (150 mg total) by mouth once for 1 dose. May repeat in 72 hours. 2 tablet BKandra Nicolas MD   amoxicillin (AMOXIL) 875 MG tablet Take 1 tablet (875 mg total) by mouth 2 (two) times daily. 20 tablet BKandra Nicolas MD        BKandra Nicolas MD 09/04/19 [phone]

## 2019-09-04 NOTE — ED Triage Notes (Signed)
Pt states last night she developed facial/teeth pain. States she gets frequent sinus infections. Afebrile.

## 2019-09-04 NOTE — Discharge Instructions (Addendum)
Take plain guaifenesin (1200mg  extended release tabs such as Mucinex) twice daily, with plenty of water, for congestion.  Get adequate rest.   May use Afrin nasal spray (or generic oxymetazoline) each morning for about 5 days and then discontinue.  Also recommend using saline nasal spray several times daily and saline nasal irrigation (AYR is a common brand).  Use Flonase nasal spray each morning after using Afrin nasal spray and saline nasal irrigation.

## 2020-01-01 ENCOUNTER — Emergency Department (INDEPENDENT_AMBULATORY_CARE_PROVIDER_SITE_OTHER)
Admission: EM | Admit: 2020-01-01 | Discharge: 2020-01-01 | Disposition: A | Discharge status: Home / Self Care | Source: Home / Self Care | Attending: Family Medicine | Admitting: Family Medicine

## 2020-01-01 ENCOUNTER — Other Ambulatory Visit: Payer: Self-pay

## 2020-01-01 DIAGNOSIS — J0101 Acute recurrent maxillary sinusitis: Secondary | ICD-10-CM | POA: Diagnosis not present

## 2020-01-01 MED ORDER — FLUCONAZOLE 150 MG PO TABS
150.0000 mg | ORAL_TABLET | Freq: Once | ORAL | 0 refills | Status: AC
Start: 2020-01-01 — End: 2020-01-01

## 2020-01-01 MED ORDER — AMOXICILLIN 875 MG PO TABS
875.0000 mg | ORAL_TABLET | Freq: Two times a day (BID) | ORAL | 0 refills | Status: DC
Start: 2020-01-01 — End: 2020-08-11

## 2020-01-01 NOTE — Discharge Instructions (Addendum)
Take plain guaifenesin (1200mg  extended release tabs such as Mucinex) twice daily, with plenty of water, for congestion. Get adequate rest.  Take prednisone 20mg  once daily for 5 days.  May use Afrin nasal spray (or generic oxymetazoline) each morning for about 5 days and then discontinue.  Also recommend using saline nasal spray several times daily and saline nasal irrigation (AYR is a common brand).  Use Flonase nasal spray each morning after using Afrin nasal spray and saline nasal irrigation.

## 2020-01-01 NOTE — ED Provider Notes (Signed)
[facility]    CSN: [id number] Arrival date & time: 01/01/20  9798      History   Chief Complaint Chief Complaint  Patient presents with  . Sinusitis    HPI Michelle Archer is a 59 y.o. female.   Patient has a history of seasonal rhinitis and recurrent sinusitis.  She has had increased sinus congestion for a week, and yesterday developed facial pain and aching in her upper teeth.  She has had a headache but denies URI symptoms or fever.  The history is provided by the patient.  Sinusitis Pain details:    Location:  Frontal and maxillary   Quality:  Aching   Severity:  Moderate   Duration:  1 day   Timing:  Constant Duration:  1 day Progression:  Worsening Chronicity:  Recurrent Context: allergies   Context: not recent URI   Relieved by:  Nothing Worsened by:  Outdoor exposure Ineffective treatments:  Saline sprays and steroid sprays Associated symptoms: congestion, headaches, rhinorrhea and tooth pain   Associated symptoms: no chills, no cough, no ear pain, no fatigue, no fever, no hoarse voice, no mouth breathing, no nausea, no shortness of breath, no sneezing, no sore throat, no swollen glands and no vertigo     Past Medical History:  Diagnosis Date  . Anxiety    takes Valium prn  . Arthritis    osteo  . Asthma   . B12 deficiency   . Bruises easily   . Chronic back pain    DDD  . Chronic neck pain    DDD  . Coronary artery disease   . Diabetes mellitus without complication ([place])    prediabetic-diet controlled  . GERD (gastroesophageal reflux disease)    takes Protonix daily  . History of bladder infections   . History of blood transfusion    last time in 03-2units;no reaction noted  . History of bronchitis    last time about 20yr ago  . History of migraine    last one 04/29/12 and took Zomig  . Hyperlipidemia    can't take any meds   . Hypertension    takes Lisinopril daily  . IBS (irritable bowel syndrome)    takes Bentyl prn  .  Insomnia    d/t pain  . [place]  . Palpitations   . Peripheral edema    takes HCTZ prn  . Pneumonia    hx of-last time 2 yrs ago  . PONV (postoperative nausea and vomiting)    pt states limited to neck movement  . Vitamin B6 deficiency   . Vitamin D deficiency     Patient Active Problem List   Diagnosis Date Noted  . Lumbar stenosis with neurogenic claudication 05/05/2012    Past Surgical History:  Procedure Laterality Date  . CARDIAC CATHETERIZATION  2003   pt states she bleed out-was hospitalized x 3 days  . CHOLECYSTECTOMY    . DIAGNOSTIC LAPAROSCOPY    . DILATION AND CURETTAGE OF UTERUS    . ESOPHAGOGASTRODUODENOSCOPY    . left foot surgery      x 2  . left knee arthroscopy    . LUMBAR LAMINECTOMY/DECOMPRESSION MICRODISCECTOMY  05/05/2012   Procedure: LUMBAR LAMINECTOMY/DECOMPRESSION MICRODISCECTOMY 2 LEVELS;  Surgeon: HCharlie Pitter MD;  [facility];  Service: Neurosurgery;  Laterality: Right;  . NECK SURGERY  2010/2011   x 2  . PARTIAL HYSTERECTOMY    . right foot surgery  x 3  . urethra sling placed      OB History   No obstetric history on file.      Home Medications    Prior to Admission medications   Medication Sig Start Date End Date Taking? Authorizing Provider  albuterol (PROVENTIL HFA;VENTOLIN HFA) 108 (90 BASE) MCG/ACT inhaler Inhale 1-2 puffs into the lungs every 4 (four) hours as needed. Shortness of breath    [provider]  amoxicillin (AMOXIL) 875 MG tablet Take 1 tablet (875 mg total) by mouth 2 (two) times daily. 01/01/20   Michelle Nicolas, MD  aspirin EC 81 MG tablet Take 81 mg by mouth daily.    [provider]  betamethasone dipropionate 0.05 % cream betamethasone dipropionate 0.05 % topical cream  APP THIN LAYER EXT AA QD    [provider]  butalbital-acetaminophen-caffeine (FIORICET, ESGIC) 50-325-40 MG per tablet Take 2 tablets by mouth every 4 (four) hours as needed.  For neck swelling    [provider]  Calcium-Vitamin D-Vitamin K (CALCIUM SOFT CHEWS PO) Take 1 tablet by mouth daily. Viactive Calcium Chews    [provider]  Certolizumab Pegol (CIMZIA STARTER KIT) 6 X 200 MG/ML KIT  07/27/19   [provider]  cyclobenzaprine (FLEXERIL) 10 MG tablet Take 10 mg by mouth every 4 (four) hours as needed. For muscle spasms    [provider]  diazepam (VALIUM) 10 MG tablet Take 10 mg by mouth every 4 (four) hours as needed. For anxiety    [provider]  diazepam (VALIUM) 10 MG tablet Take 1 tablet (10 mg total) by mouth every 4 (four) hours as needed for anxiety (spasms). 05/06/12   Earnie Larsson, MD  dicyclomine (BENTYL) 20 MG tablet Take 20 mg by mouth every 6 (six) hours.    [provider]  fluconazole (DIFLUCAN) 150 MG tablet Take 1 tablet (150 mg total) by mouth once for 1 dose. May repeat in 72 hours. 01/01/20 01/01/20  Michelle Nicolas, MD  hydrochlorothiazide (HYDRODIURIL) 25 MG tablet Take 25 mg by mouth daily as needed. For feet/anckle swelling    [provider]  HYDROcodone-acetaminophen (LORTAB) 7.5-500 MG/15ML solution Take 15 mLs by mouth every 4 (four) hours as needed. For pain    [provider]  ketorolac (TORADOL) 10 MG tablet Take 10-20 mg by mouth every 4 (four) hours as needed. 20 mg at onset of pain then take 10 mg every 4 hours as needed for pain    [provider]  lisinopril (PRINIVIL,ZESTRIL) 10 MG tablet Take 5 mg by mouth daily.    [provider]  naproxen (NAPROSYN) 500 MG tablet Take 500 mg by mouth every 6 (six) hours as needed. For pain    [provider]  oxyCODONE-acetaminophen (PERCOCET/ROXICET) 5-325 MG per tablet Take 1-2 tablets by mouth every 4 (four) hours as needed. For pain    [provider]  oxyCODONE-acetaminophen (PERCOCET/ROXICET) 5-325 MG per tablet Take 1-2 tablets by mouth every 4 (four) hours as needed. 05/06/12    Earnie Larsson, MD  pantoprazole (PROTONIX) 40 MG tablet Take 40-80 mg by mouth daily. Start with 40 mg and will take additional 40 mg if needed    [provider]  zolmitriptan (ZOMIG) 5 MG tablet Take 5 mg by mouth as needed. For migraine    [provider]    Family History Family History  Problem Relation Age of Onset  . COPD Mother   . Emphysema  Mother   . Hypertension Mother   . Hyperlipidemia Mother   . Cancer Father   . Diabetes Father     Social History Social History   Tobacco Use  . Smoking status: Never Smoker  . Smokeless tobacco: Never Used  Vaping Use  . Vaping Use: Never used  Substance Use Topics  . Alcohol use: Yes    Comment: red wine-its been a while  . Drug use: No     Allergies   Cephalosporins, Other, Simvastatin, and Zetia [ezetimibe]   Review of Systems Review of Systems  Constitutional: Negative for chills, fatigue and fever.  HENT: Positive for congestion and rhinorrhea. Negative for ear pain, hoarse voice, sneezing and sore throat.   Respiratory: Negative for cough and shortness of breath.   Gastrointestinal: Negative for nausea.  Neurological: Positive for headaches. Negative for vertigo.  All other systems reviewed and are negative.    Physical Exam Triage Vital Signs ED Triage Vitals  Enc Vitals Group     BP 01/01/20 0823 125/81     Pulse Rate 01/01/20 0823 91     Resp 01/01/20 0823 18     Temp 01/01/20 0823 98.3 F (36.8 C)     Temp Source 01/01/20 0823 Oral     SpO2 01/01/20 0823 99 %     Weight --      Height --      Head Circumference --      Peak Flow --      Pain Score 01/01/20 0821 8     Pain Loc --      Pain Edu? --      Excl. in [place]? --    No data found.  Updated Vital Signs BP 125/81 (BP Location: Right Arm)   Pulse 91   Temp 98.3 F (36.8 C) (Oral)   Resp 18   SpO2 99%   Visual Acuity Right Eye Distance:   Left Eye Distance:   Bilateral Distance:    Right Eye Near:   Left Eye  Near:    Bilateral Near:     Physical Exam Vitals and nursing note reviewed.  Constitutional:      General: She is not in acute distress. HENT:     Head: Normocephalic.     Right Ear: Tympanic membrane normal.     Left Ear: Tympanic membrane normal.     Nose: Congestion present.     Right Turbinates: Enlarged.     Left Turbinates: Enlarged.     Right Sinus: Maxillary sinus tenderness and frontal sinus tenderness present.     Left Sinus: Maxillary sinus tenderness and frontal sinus tenderness present.     Mouth/Throat:     Pharynx: Oropharynx is clear.  Eyes:     Conjunctiva/sclera: Conjunctivae normal.     Pupils: Pupils are equal, round, and reactive to light.  Cardiovascular:     Rate and Rhythm: Regular rhythm.     Heart sounds: Normal heart sounds.  Pulmonary:     Breath sounds: Normal breath sounds.  Lymphadenopathy:     Cervical: No cervical adenopathy.  Skin:    General: Skin is warm and dry.     Findings: No rash.  Neurological:     Mental Status: She is alert.      UC Treatments / Results  Labs (all labs ordered are listed, but only abnormal results are displayed) Labs Reviewed - No data to display  EKG   Radiology No results found.  Procedures Procedures (  including critical care time)  Medications Ordered in UC Medications - No data to display  Initial Impression / Assessment and Plan / UC Course  I have reviewed the triage vital signs and the nursing notes.  Pertinent labs & imaging results that were available during my care of the patient were reviewed by me and considered in my medical decision making (see chart for details).    Begin amoxicillin for 10 days. Rx for Diflucan at patient's request. Followup with Family Doctor if not improved in 10 days.  Final Clinical Impressions(s) / UC Diagnoses   Final diagnoses:  Recurrent maxillary sinusitis     Discharge Instructions     Take plain guaifenesin ('1200mg'$  extended release tabs  such as Mucinex) twice daily, with plenty of water, for congestion. Get adequate rest.  Take prednisone '20mg'$  once daily for 5 days.  May use Afrin nasal spray (or generic oxymetazoline) each morning for about 5 days and then discontinue.  Also recommend using saline nasal spray several times daily and saline nasal irrigation (AYR is a common brand).  Use Flonase nasal spray each morning after using Afrin nasal spray and saline nasal irrigation.      ED Prescriptions    Medication Sig Dispense Auth. Provider   amoxicillin (AMOXIL) 875 MG tablet Take 1 tablet (875 mg total) by mouth 2 (two) times daily. 20 tablet Michelle Nicolas, MD   fluconazole (DIFLUCAN) 150 MG tablet Take 1 tablet (150 mg total) by mouth once for 1 dose. May repeat in 72 hours. 2 tablet Michelle Nicolas, MD        Michelle Nicolas, MD 01/01/20 [phone]

## 2020-01-01 NOTE — ED Triage Notes (Signed)
Patient presents to Urgent Care with complaints of teeth pain on the top due to sinus pressure since yesterday. Patient reports this happens several times per year, states she is getting a sinus infection. Has begun taking mucinex, nasal spay, requesting antibiotics.

## 2020-08-11 ENCOUNTER — Other Ambulatory Visit: Payer: Self-pay

## 2020-08-11 ENCOUNTER — Emergency Department (INDEPENDENT_AMBULATORY_CARE_PROVIDER_SITE_OTHER)
Admission: EM | Admit: 2020-08-11 | Discharge: 2020-08-11 | Disposition: A | Discharge status: Home / Self Care | Source: Home / Self Care | Attending: Family Medicine | Admitting: Family Medicine

## 2020-08-11 DIAGNOSIS — J0101 Acute recurrent maxillary sinusitis: Secondary | ICD-10-CM

## 2020-08-11 MED ORDER — FLUCONAZOLE 150 MG PO TABS
150.0000 mg | ORAL_TABLET | Freq: Every day | ORAL | 0 refills | Status: DC
Start: 2020-08-11 — End: 2021-03-30

## 2020-08-11 MED ORDER — AMOXICILLIN 875 MG PO TABS
875.0000 mg | ORAL_TABLET | Freq: Two times a day (BID) | ORAL | 0 refills | Status: DC
Start: 2020-08-11 — End: 2021-03-30

## 2020-08-11 NOTE — Discharge Instructions (Addendum)
Continue to drink plenty of fluids Consider a couple days of prednisone Take antibiotic 2 times a day Use the Diflucan if needed Follow-up with your primary care

## 2020-08-11 NOTE — ED Triage Notes (Signed)
Pt c/o facial pain/ear pain and jaw pain x 1 week. Hx of sinus infections. Sinus rinses prn. Mucinex and afrin prn. Pain 8/10

## 2020-08-11 NOTE — ED Provider Notes (Signed)
[name]    CSN: [id number] Arrival date & time: 08/11/20  1452      History   Chief Complaint Chief Complaint  Patient presents with  . Nasal Congestion    HPI Michelle Archer is a 60 y.o. female.   HPI   According to patient she has recurring maxillary sinusitis.  She is on a biologic because of rheumatoid arthritis.  This reduces her immunity and ability to fight infection.  She has allergies.  At least twice a year she ends up with a sinus infection.  This time she has been sick for over a week.  Sinus pressure and pain.  Dental pain.  Headache.  Fatigue.  Mild sore throat.  Postnasal drip No known exposure to Covid.  She is Covid vaccinated She also has multiple medical problems including arthritis, diabetes, coronary artery disease, GERD.  She states she is compliant with her medical care.  She states she called her primary care doctor but was unable to be seen  Past Medical History:  Diagnosis Date  . Anxiety    takes Valium prn  . Arthritis    osteo  . Asthma   . B12 deficiency   . Bruises easily   . Chronic back pain    DDD  . Chronic neck pain    DDD  . Coronary artery disease   . Diabetes mellitus without complication ([place])    prediabetic-diet controlled  . GERD (gastroesophageal reflux disease)    takes Protonix daily  . History of bladder infections   . History of blood transfusion    last time in 03-2units;no reaction noted  . History of bronchitis    last time about 22yrs ago  . History of migraine    last one 04/29/12 and took Zomig  . Hyperlipidemia    can't take any meds   . Hypertension    takes Lisinopril daily  . IBS (irritable bowel syndrome)    takes Bentyl prn  . Insomnia    d/t pain  . Myocardial infarction ([place]) 2003  . Palpitations   . Peripheral edema    takes HCTZ prn  . Pneumonia    hx of-last time 2 yrs ago  . PONV (postoperative nausea and vomiting)    pt states limited to neck movement  . Vitamin B6  deficiency   . Vitamin D deficiency     Patient Active Problem List   Diagnosis Date Noted  . Lumbar stenosis with neurogenic claudication 05/05/2012    Past Surgical History:  Procedure Laterality Date  . CARDIAC CATHETERIZATION  2003   pt states she bleed out-was hospitalized x 3 days  . CHOLECYSTECTOMY    . DIAGNOSTIC LAPAROSCOPY    . DILATION AND CURETTAGE OF UTERUS    . ESOPHAGOGASTRODUODENOSCOPY    . left foot surgery      x 2  . left knee arthroscopy    . LUMBAR LAMINECTOMY/DECOMPRESSION MICRODISCECTOMY  05/05/2012   Procedure: LUMBAR LAMINECTOMY/DECOMPRESSION MICRODISCECTOMY 2 LEVELS;  Surgeon: Charlie Pitter, MD;  Location: [facility];  Service: Neurosurgery;  Laterality: Right;  . NECK SURGERY  2010/2011   x 2  . PARTIAL HYSTERECTOMY    . right foot surgery     x 3  . urethra sling placed      OB History   No obstetric history on file.      Home Medications    Prior to Admission medications   Medication Sig Start Date End  Date Taking? Authorizing Provider  fluconazole (DIFLUCAN) 150 MG tablet Take 1 tablet (150 mg total) by mouth daily. Repeat in 1 week if needed 08/11/20  Yes Raylene Everts, MD  Guselkumab [facility]) 100 MG/ML SOPN Tremfya 100 mg/mL subcutaneous auto-injector 09/18/19  Yes [provider]  albuterol (PROVENTIL HFA;VENTOLIN HFA) 108 (90 BASE) MCG/ACT inhaler Inhale 1-2 puffs into the lungs every 4 (four) hours as needed. Shortness of breath    [provider]  amoxicillin (AMOXIL) 875 MG tablet Take 1 tablet (875 mg total) by mouth 2 (two) times daily. 08/11/20   Raylene Everts, MD  aspirin EC 81 MG tablet Take 81 mg by mouth daily.    [provider]  betamethasone dipropionate 0.05 % cream betamethasone dipropionate 0.05 % topical cream  APP THIN LAYER EXT AA QD    [provider]  butalbital-acetaminophen-caffeine (FIORICET, ESGIC) 50-325-40 MG per tablet Take 2 tablets by mouth every 4 (four) hours  as needed. For neck swelling    [provider]  Calcium-Vitamin D-Vitamin K (CALCIUM SOFT CHEWS PO) Take 1 tablet by mouth daily. Viactive Calcium Chews    [provider]  Certolizumab Pegol (CIMZIA STARTER KIT) 6 X 200 MG/ML KIT  07/27/19   [provider]  cyclobenzaprine (FLEXERIL) 10 MG tablet Take 10 mg by mouth every 4 (four) hours as needed. For muscle spasms    [provider]  diazepam (VALIUM) 10 MG tablet Take 10 mg by mouth every 4 (four) hours as needed. For anxiety    [provider]  diazepam (VALIUM) 10 MG tablet Take 1 tablet (10 mg total) by mouth every 4 (four) hours as needed for anxiety (spasms). 05/06/12   Earnie Larsson, MD  dicyclomine (BENTYL) 20 MG tablet Take 20 mg by mouth every 6 (six) hours.    [provider]  hydrochlorothiazide (HYDRODIURIL) 25 MG tablet Take 25 mg by mouth daily as needed. For feet/anckle swelling    [provider]  HYDROcodone-acetaminophen (LORTAB) 7.5-500 MG/15ML solution Take 15 mLs by mouth every 4 (four) hours as needed. For pain    [provider]  ketorolac (TORADOL) 10 MG tablet Take 10-20 mg by mouth every 4 (four) hours as needed. 20 mg at onset of pain then take 10 mg every 4 hours as needed for pain    [provider]  lisinopril (PRINIVIL,ZESTRIL) 10 MG tablet Take 5 mg by mouth daily.    [provider]  naproxen (NAPROSYN) 500 MG tablet Take 500 mg by mouth every 6 (six) hours as needed. For pain    [provider]  oxyCODONE-acetaminophen (PERCOCET/ROXICET) 5-325 MG per tablet Take 1-2 tablets by mouth every 4 (four) hours as needed. For pain    [provider]  oxyCODONE-acetaminophen (PERCOCET/ROXICET) 5-325 MG per tablet Take 1-2 tablets by mouth every 4 (four) hours as needed. 05/06/12   Earnie Larsson, MD  pantoprazole (PROTONIX) 40 MG tablet Take 40-80 mg by mouth daily. Start with 40 mg and will take additional 40 mg if needed     [provider]  zolmitriptan (ZOMIG) 5 MG tablet Take 5 mg by mouth as needed. For migraine    [provider]    Family History Family History  Problem Relation Age of Onset  . COPD Mother   . Emphysema Mother   . Hypertension Mother   . Hyperlipidemia Mother   . Cancer Father   . Diabetes Father     Social History  Social History   Tobacco Use  . Smoking status: Never Smoker  . Smokeless tobacco: Never Used  Vaping Use  . Vaping Use: Never used  Substance Use Topics  . Alcohol use: Yes    Comment: red wine-its been a while  . Drug use: No     Allergies   Cephalosporins, Keflex [cephalexin], Other, Septra [sulfamethoxazole-trimethoprim], Simvastatin, and Zetia [ezetimibe]   Review of Systems Review of Systems See HPI  Physical Exam Triage Vital Signs ED Triage Vitals  Enc Vitals Group     BP 08/11/20 1500 120/80     Pulse Rate 08/11/20 1500 (!) 114     Resp 08/11/20 1500 17     Temp 08/11/20 1500 98.6 F (37 C)     Temp Source 08/11/20 1500 Oral     SpO2 08/11/20 1500 99 %     Weight --      Height --      Head Circumference --      Peak Flow --      Pain Score 08/11/20 1502 8     Pain Loc --      Pain Edu? --      Excl. in [place]? --    No data found.  Updated Vital Signs BP 120/80 (BP Location: Right Arm)   Pulse (!) 114   Temp 98.6 F (37 C) (Oral)   Resp 17   SpO2 99%      Physical Exam Constitutional:      General: She is not in acute distress.    Appearance: She is well-developed and well-nourished. She is ill-appearing.     Comments: Patient appears uncomfortable.  HENT:     Head: Normocephalic and atraumatic.     Right Ear: Tympanic membrane and ear canal normal.     Left Ear: Tympanic membrane normal.     Nose: Congestion present.     Comments: Swollen turbinates, erythematous.  Clear discharge    Mouth/Throat:     Pharynx: Posterior oropharyngeal erythema present.  Eyes:     Conjunctiva/sclera: Conjunctivae  normal.     Pupils: Pupils are equal, round, and reactive to light.  Cardiovascular:     Rate and Rhythm: Normal rate and regular rhythm.     Heart sounds: Normal heart sounds.  Pulmonary:     Effort: Pulmonary effort is normal. No respiratory distress.     Breath sounds: Normal breath sounds.  Abdominal:     Palpations: Abdomen is soft.  Musculoskeletal:        General: No edema. Normal range of motion.     Cervical back: Normal range of motion.  Lymphadenopathy:     Cervical: Cervical adenopathy present.  Skin:    General: Skin is warm and dry.  Neurological:     Mental Status: She is alert.  Psychiatric:        Behavior: Behavior normal.      UC Treatments / Results  Labs (all labs ordered are listed, but only abnormal results are displayed) Labs Reviewed - No data to display  EKG   Radiology No results found.  Procedures Procedures (including critical care time)  Medications Ordered in UC Medications - No data to display  Initial Impression / Assessment and Plan / UC Course  I have reviewed the triage vital signs and the nursing notes.  Pertinent labs & imaging results that were available during my care of the patient were reviewed by me and considered in my medical decision making (see  chart for details).     Patient states that she responds well to Amoxil.  She is to continue her Aggie Moats, pushing fluids, allergy regimen, and consider taking a couple days of prednisone to help with her symptoms.  Patient has prednisone at home.  We will see her primary care doctor if she fails to improve Final Clinical Impressions(s) / UC Diagnoses   Final diagnoses:  Acute recurrent maxillary sinusitis     Discharge Instructions     Continue to drink plenty of fluids Consider a couple days of prednisone Take antibiotic 2 times a day Use the Diflucan if needed Follow-up with your primary care   ED Prescriptions    Medication Sig Dispense Auth. Provider    amoxicillin (AMOXIL) 875 MG tablet Take 1 tablet (875 mg total) by mouth 2 (two) times daily. 20 tablet Raylene Everts, MD   fluconazole (DIFLUCAN) 150 MG tablet Take 1 tablet (150 mg total) by mouth daily. Repeat in 1 week if needed 2 tablet Raylene Everts, MD     PDMP not reviewed this encounter.   Raylene Everts, MD 08/11/20 [id number]

## 2021-03-30 ENCOUNTER — Emergency Department (INDEPENDENT_AMBULATORY_CARE_PROVIDER_SITE_OTHER)
Admission: EM | Admit: 2021-03-30 | Discharge: 2021-03-30 | Disposition: A | Discharge status: Home / Self Care | Payer: Medicare Other | Source: Home / Self Care | Attending: Family Medicine | Admitting: Family Medicine

## 2021-03-30 ENCOUNTER — Other Ambulatory Visit: Payer: Self-pay

## 2021-03-30 DIAGNOSIS — J0101 Acute recurrent maxillary sinusitis: Secondary | ICD-10-CM | POA: Diagnosis not present

## 2021-03-30 MED ORDER — FLUTICASONE PROPIONATE 50 MCG/ACT NA SUSP: 2.0000 | Freq: Every day | NASAL | 0 refills | Status: AC

## 2021-03-30 MED ORDER — AMOXICILLIN 875 MG PO TABS: 875.0000 mg | ORAL_TABLET | Freq: Two times a day (BID) | ORAL | 0 refills | Status: AC

## 2021-03-30 MED ORDER — PREDNISONE 20 MG PO TABS: 20.0000 mg | ORAL_TABLET | Freq: Two times a day (BID) | ORAL | 0 refills | Status: AC

## 2021-03-30 MED ORDER — FLUCONAZOLE 150 MG PO TABS: 150.0000 mg | ORAL_TABLET | Freq: Every day | ORAL | 0 refills | Status: AC

## 2021-03-30 NOTE — Discharge Instructions (Signed)
Continue to drink lots of water Take the amoxicillin 2 times a day until complete Take prednisone 2 times a day Use Flonase twice a day initially then down to once a day through allergy season I have prescribed Diflucan to take if you need it Return as needed

## 2021-03-30 NOTE — ED Provider Notes (Signed)
[name]    CSN: [id number] Arrival date & time: 03/30/21  1326      History   Chief Complaint Chief Complaint  Patient presents with   Facial Pain    Pt states that she has some facial pain, and nasal congestion. X1 week    HPI Michelle Archer is a 60 y.o. female.   HPI  Patient has allergies.  Recurring sinus infections.  She is on Tremfya which she states reduces her immunity.  She is here with infection its been going on for over a week.  Pain in her cheeks that radiates into her upper teeth.  Purulent discharge.  She has tried Mucinex DM, she is drinking a lot of fluids, she is out of her Flonase.  She feels like she needs an antibiotic.  I saw her in February she states she responded well to the treatment offered at that time.  Past Medical History:  Diagnosis Date   Anxiety    takes Valium prn   Arthritis    osteo   Asthma    B12 deficiency    Bruises easily    Chronic back pain    DDD   Chronic neck pain    DDD   Coronary artery disease    Diabetes mellitus without complication ([facility])    prediabetic-diet controlled   GERD (gastroesophageal reflux disease)    takes Protonix daily   History of bladder infections    History of blood transfusion    last time in 03-2units;no reaction noted   History of bronchitis    last time about 23yrs ago   History of migraine    last one 04/29/12 and took Zomig   Hyperlipidemia    can't take any meds    Hypertension    takes Lisinopril daily   IBS (irritable bowel syndrome)    takes Bentyl prn   Insomnia    d/t pain   Myocardial infarction ([place]) 2003   Palpitations    Peripheral edema    takes HCTZ prn   Pneumonia    hx of-last time 2 yrs ago   PONV (postoperative nausea and vomiting)    pt states limited to neck movement   Vitamin B6 deficiency    Vitamin D deficiency     Patient Active Problem List   Diagnosis Date Noted   Lumbar stenosis with neurogenic claudication 05/05/2012    Past  Surgical History:  Procedure Laterality Date   CARDIAC CATHETERIZATION  2003   pt states she bleed out-was hospitalized x 3 days   CHOLECYSTECTOMY     DIAGNOSTIC LAPAROSCOPY     DILATION AND CURETTAGE OF UTERUS     ESOPHAGOGASTRODUODENOSCOPY     left foot surgery      x 2   left knee arthroscopy     LUMBAR LAMINECTOMY/DECOMPRESSION MICRODISCECTOMY  05/05/2012   Procedure: LUMBAR LAMINECTOMY/DECOMPRESSION MICRODISCECTOMY 2 LEVELS;  Surgeon: Charlie Pitter, MD;  Location: [place] [facility];  Service: Neurosurgery;  Laterality: Right;   NECK SURGERY  2010/2011   x 2   PARTIAL HYSTERECTOMY     right foot surgery     x 3   urethra sling placed      OB History   No obstetric history on file.      Home Medications    Prior to Admission medications   Medication Sig Start Date End Date Taking? Authorizing Provider  albuterol (PROVENTIL HFA;VENTOLIN HFA) 108 (90 BASE) MCG/ACT inhaler Inhale 1-2 puffs  into the lungs every 4 (four) hours as needed. Shortness of breath   Yes [provider]  aspirin EC 81 MG tablet Take 81 mg by mouth daily.   Yes [provider]  butalbital-acetaminophen-caffeine (FIORICET, ESGIC) 50-325-40 MG per tablet Take 2 tablets by mouth every 4 (four) hours as needed. For neck swelling   Yes [provider]  Calcium-Vitamin D-Vitamin K (CALCIUM SOFT CHEWS PO) Take 1 tablet by mouth daily. Viactive Calcium Chews   Yes [provider]  cyclobenzaprine (FLEXERIL) 10 MG tablet Take 10 mg by mouth every 4 (four) hours as needed. For muscle spasms   Yes [provider]  diazepam (VALIUM) 10 MG tablet Take 1 tablet (10 mg total) by mouth every 4 (four) hours as needed for anxiety (spasms). 05/06/12  Yes Pool, Sherilyn Cooter, MD  dicyclomine (BENTYL) 20 MG tablet Take 20 mg by mouth every 6 (six) hours.   Yes [provider]  fluticasone (FLONASE) 50 MCG/ACT nasal spray Place 2 sprays into both nostrils daily. 03/30/21  Yes Eustace Moore, MD  Guselkumab [facility]) 100 MG/ML SOPN Tremfya 100 mg/mL subcutaneous auto-injector 09/18/19  Yes [provider]  HYDROcodone-acetaminophen (LORTAB) 7.5-500 MG/15ML solution Take 15 mLs by mouth every 4 (four) hours as needed. For pain   Yes [provider]  ketorolac (TORADOL) 10 MG tablet Take 10-20 mg by mouth every 4 (four) hours as needed. 20 mg at onset of pain then take 10 mg every 4 hours as needed for pain   Yes [provider]  lisinopril (PRINIVIL,ZESTRIL) 10 MG tablet Take 5 mg by mouth daily.   Yes [provider]  metFORMIN (GLUCOPHAGE) 500 MG tablet Take by mouth.   Yes [provider]  naproxen (NAPROSYN) 500 MG tablet Take 500 mg by mouth every 6 (six) hours as needed. For pain   Yes [provider]  pantoprazole (PROTONIX) 40 MG tablet Take 40-80 mg by mouth daily. Start with 40 mg and will take additional 40 mg if needed   Yes [provider]  predniSONE (DELTASONE) 20 MG tablet Take 1 tablet (20 mg total) by mouth 2 (two) times daily with a meal. 03/30/21  Yes Eustace Moore, MD  zolmitriptan (ZOMIG) 5 MG tablet Take 5 mg by mouth as needed. For migraine   Yes [provider]  amoxicillin (AMOXIL) 875 MG tablet Take 1 tablet (875 mg total) by mouth 2 (two) times daily. 03/30/21   Eustace Moore, MD  Certolizumab Pegol (CIMZIA STARTER KIT) 6 X 200 MG/ML KIT  07/27/19   [provider]  fluconazole (DIFLUCAN) 150 MG tablet Take 1 tablet (150 mg total) by mouth daily. Repeat in 1 week if needed 03/30/21   Eustace Moore, MD    Family History Family History  Problem Relation Age of Onset   COPD Mother    Emphysema Mother    Hypertension Mother    Hyperlipidemia Mother    Cancer Father    Diabetes Father     Social History Social History   Tobacco Use   Smoking status: Never   Smokeless tobacco: Never  Vaping Use   Vaping Use: Never used  Substance Use Topics    Alcohol use: Yes    Comment: red wine-its been a while   Drug use: No     Allergies   Cephalosporins, Keflex [cephalexin], Other, Septra [sulfamethoxazole-trimethoprim], Simvastatin, and Zetia [ezetimibe]   Review of Systems Review of Systems See HPI  Physical Exam Triage Vital Signs ED Triage Vitals  Enc Vitals Group     BP 03/30/21 1348 104/71     Pulse Rate 03/30/21 1348 92     Resp 03/30/21 1348 20     Temp 03/30/21 1348 98.4 F (36.9 C)     Temp Source 03/30/21 1348 Oral     SpO2 03/30/21 1348 97 %     Weight 03/30/21 1343 190 lb (86.2 kg)     Height 03/30/21 1343 $RemoveBefor'5\' 5"'typYhgrDJtfc$  (1.651 m)     Head Circumference --      Peak Flow --      Pain Score 03/30/21 1343 7     Pain Loc --      Pain Edu? --      Excl. in [place]? --    No data found.  Updated Vital Signs BP 104/71 (BP Location: Right Arm)   Pulse 92   Temp 98.4 F (36.9 C) (Oral)   Resp 20   Ht $R'5\' 5"'Ft$  (1.651 m)   Wt 86.2 kg   SpO2 97%   BMI 31.62 kg/m      Physical Exam Constitutional:      General: She is not in acute distress.    Appearance: She is well-developed. She is ill-appearing.  HENT:     Head: Normocephalic and atraumatic.     Right Ear: Tympanic membrane, ear canal and external ear normal.     Left Ear: Tympanic membrane, ear canal and external ear normal.     Nose: Congestion and rhinorrhea present.     Mouth/Throat:     Mouth: Mucous membranes are moist.     Pharynx: Posterior oropharyngeal erythema present.     Comments: Tenderness of her facial sinuses.  Nasal membranes are swollen and red. Eyes:     Conjunctiva/sclera: Conjunctivae normal.     Pupils: Pupils are equal, round, and reactive to light.  Cardiovascular:     Rate and Rhythm: Normal rate and regular rhythm.     Heart sounds: Normal heart sounds.  Pulmonary:     Effort: Pulmonary effort is normal. No respiratory distress.     Breath sounds: Normal breath sounds. No wheezing or rales.  Abdominal:     General: There is no  distension.     Palpations: Abdomen is soft.  Musculoskeletal:        General: Normal range of motion.     Cervical back: Normal range of motion.  Lymphadenopathy:     Cervical: No cervical adenopathy.  Skin:    General: Skin is warm and dry.  Neurological:     Mental Status: She is alert.  Psychiatric:        Mood and Affect: Mood normal.        Behavior: Behavior normal.     UC Treatments / Results  Labs (all labs ordered are listed, but only abnormal results are displayed) Labs Reviewed - No data to display  EKG   Radiology No results found.  Procedures Procedures (including critical care time)  Medications Ordered in UC Medications - No data to display  Initial Impression / Assessment and Plan / UC Course  I have reviewed the triage vital signs and the nursing notes.  Pertinent labs & imaging results that were available during my care of the patient were reviewed by me and considered in my medical decision making (see chart for details).      Final Clinical Impressions(s) / UC Diagnoses   Final diagnoses:  Acute recurrent maxillary sinusitis     Discharge Instructions      Continue to drink lots of water Take the amoxicillin 2 times a day until complete Take prednisone 2 times a day Use Flonase twice a day initially then down to once a day through allergy season I have prescribed Diflucan to take if you need it Return as needed   ED Prescriptions     Medication Sig Dispense Auth. Provider   amoxicillin (AMOXIL) 875 MG tablet Take 1 tablet (875 mg total) by mouth 2 (two) times daily. 20 tablet Raylene Everts, MD   fluconazole (DIFLUCAN) 150 MG tablet Take 1 tablet (150 mg total) by mouth daily. Repeat in 1 week if needed 2 tablet Raylene Everts, MD   fluticasone [facility]) 50 MCG/ACT nasal spray Place 2 sprays into both nostrils daily. 16 g Raylene Everts, MD   predniSONE (DELTASONE) 20 MG tablet Take 1 tablet (20 mg total) by mouth 2  (two) times daily with a meal. 10 tablet Raylene Everts, MD      PDMP not reviewed this encounter.   Raylene Everts, MD 03/30/21 [phone]

## 2021-03-30 NOTE — ED Triage Notes (Signed)
Pt states that she has some facial pain and nasal congestion. X1 week. Pt states that she is vaccinated.

## 2021-06-25 ENCOUNTER — Telehealth: Payer: Self-pay

## 2021-06-25 ENCOUNTER — Emergency Department (INDEPENDENT_AMBULATORY_CARE_PROVIDER_SITE_OTHER)
Admission: EM | Admit: 2021-06-25 | Discharge: 2021-06-25 | Disposition: A | Discharge status: Home / Self Care | Payer: Medicare Other | Source: Home / Self Care

## 2021-06-25 ENCOUNTER — Other Ambulatory Visit: Payer: Self-pay

## 2021-06-25 DIAGNOSIS — N3001 Acute cystitis with hematuria: Secondary | ICD-10-CM

## 2021-06-25 LAB — POCT URINALYSIS DIP (MANUAL ENTRY)
Bilirubin, UA: NEGATIVE
Glucose, UA: NEGATIVE mg/dL
Ketones, POC UA: NEGATIVE mg/dL
Nitrite, UA: POSITIVE — AB
Protein Ur, POC: NEGATIVE mg/dL
Spec Grav, UA: 1.025 (ref 1.010–1.025)
Urobilinogen, UA: 0.2 E.U./dL
pH, UA: 5.5 (ref 5.0–8.0)

## 2021-06-25 MED ORDER — CIPROFLOXACIN HCL 250 MG PO TABS: 250.0000 mg | ORAL_TABLET | Freq: Two times a day (BID) | ORAL | 0 refills | Status: AC

## 2021-06-25 NOTE — Discharge Instructions (Addendum)
Advised patient to take medication as directed with food to completion.  Encouraged patient to increase daily water intake while taking this medication.  Advised patient we will follow-up once urine culture results are obtained.

## 2021-06-25 NOTE — ED Provider Notes (Signed)
[name]    CSN: [id number] Arrival date & time: 06/25/21  0900      History   Chief Complaint Chief Complaint  Patient presents with   Dysuria   Hematuria    HPI XENA PROPST is a 61 y.o. female.   HPI 61 year old female presents with dysuria for 1 week.  Reports noticing blood initially.  Patient reports taking Macrobid routinely following intercourse (100 mg following coitus and 100 mg 12 hours later) and has had this for 7 days now without relief.  Past Medical History:  Diagnosis Date   Anxiety    takes Valium prn   Arthritis    osteo   Asthma    B12 deficiency    Bruises easily    Chronic back pain    DDD   Chronic neck pain    DDD   Coronary artery disease    Diabetes mellitus without complication (HCC)    prediabetic-diet controlled   GERD (gastroesophageal reflux disease)    takes Protonix daily   History of bladder infections    History of blood transfusion    last time in 03-2units;no reaction noted   History of bronchitis    last time about 76yrs ago   History of migraine    last one 04/29/12 and took Zomig   Hyperlipidemia    can't take any meds    Hypertension    takes Lisinopril daily   IBS (irritable bowel syndrome)    takes Bentyl prn   Insomnia    d/t pain   Myocardial infarction ([place]) 2003   Palpitations    Peripheral edema    takes HCTZ prn   Pneumonia    hx of-last time 2 yrs ago   PONV (postoperative nausea and vomiting)    pt states limited to neck movement   Vitamin B6 deficiency    Vitamin D deficiency     Patient Active Problem List   Diagnosis Date Noted   Lumbar stenosis with neurogenic claudication 05/05/2012    Past Surgical History:  Procedure Laterality Date   CARDIAC CATHETERIZATION  2003   pt states she bleed out-was hospitalized x 3 days   CHOLECYSTECTOMY     DIAGNOSTIC LAPAROSCOPY     DILATION AND CURETTAGE OF UTERUS     ESOPHAGOGASTRODUODENOSCOPY     left foot surgery      x 2   left  knee arthroscopy     LUMBAR LAMINECTOMY/DECOMPRESSION MICRODISCECTOMY  05/05/2012   Procedure: LUMBAR LAMINECTOMY/DECOMPRESSION MICRODISCECTOMY 2 LEVELS;  Surgeon: Charlie Pitter, MD;  Location: [facility];  Service: Neurosurgery;  Laterality: Right;   NECK SURGERY  2010/2011   x 2   PARTIAL HYSTERECTOMY     right foot surgery     x 3   urethra sling placed      OB History   No obstetric history on file.      Home Medications    Prior to Admission medications   Medication Sig Start Date End Date Taking? Authorizing Provider  ciprofloxacin (CIPRO) 250 MG tablet Take 1 tablet (250 mg total) by mouth every 12 (twelve) hours. 06/25/21  Yes Eliezer Lofts, FNP  phenazopyridine (PYRIDIUM) 200 MG tablet  05/24/14  Yes [provider]  albuterol (PROVENTIL HFA;VENTOLIN HFA) 108 (90 BASE) MCG/ACT inhaler Inhale 1-2 puffs into the lungs every 4 (four) hours as needed. Shortness of breath    [provider]  amoxicillin (AMOXIL) 875 MG tablet Take 1 tablet (427  mg total) by mouth 2 (two) times daily. 03/30/21   Raylene Everts, MD  aspirin EC 81 MG tablet Take 81 mg by mouth daily.    [provider]  butalbital-acetaminophen-caffeine (FIORICET, ESGIC) 50-325-40 MG per tablet Take 2 tablets by mouth every 4 (four) hours as needed. For neck swelling    [provider]  Calcium-Vitamin D-Vitamin K (CALCIUM SOFT CHEWS PO) Take 1 tablet by mouth daily. Viactive Calcium Chews    [provider]  Certolizumab Pegol (CIMZIA STARTER KIT) 6 X 200 MG/ML KIT  07/27/19   [provider]  cyclobenzaprine (FLEXERIL) 10 MG tablet Take 10 mg by mouth every 4 (four) hours as needed. For muscle spasms    [provider]  diazepam (VALIUM) 10 MG tablet Take 1 tablet (10 mg total) by mouth every 4 (four) hours as needed for anxiety (spasms). 05/06/12   Earnie Larsson, MD  dicyclomine (BENTYL) 20 MG tablet Take 20 mg by mouth every 6 (six) hours.    [provider]  fluconazole (DIFLUCAN) 150 MG tablet Take 1 tablet (150 mg total) by mouth daily. Repeat in 1 week if needed 03/30/21   Raylene Everts, MD  fluticasone [facility]) 50 MCG/ACT nasal spray Place 2 sprays into both nostrils daily. 03/30/21   Raylene Everts, MD  Guselkumab [facility]) 100 MG/ML SOPN Tremfya 100 mg/mL subcutaneous auto-injector 09/18/19   [provider]  HYDROcodone-acetaminophen (LORTAB) 7.5-500 MG/15ML solution Take 15 mLs by mouth every 4 (four) hours as needed. For pain    [provider]  ketorolac (TORADOL) 10 MG tablet Take 10-20 mg by mouth every 4 (four) hours as needed. 20 mg at onset of pain then take 10 mg every 4 hours as needed for pain    [provider]  lisinopril (PRINIVIL,ZESTRIL) 10 MG tablet Take 5 mg by mouth daily.    [provider]  metFORMIN (GLUCOPHAGE) 500 MG tablet Take by mouth.    [provider]  naproxen (NAPROSYN) 500 MG tablet Take 500 mg by mouth every 6 (six) hours as needed. For pain    [provider]  nitrofurantoin, macrocrystal-monohydrate, (MACROBID) 100 MG capsule Take 100 mg by mouth 2 (two) times daily. 02/28/21   [provider]  pantoprazole (PROTONIX) 40 MG tablet Take 40-80 mg by mouth daily. Start with 40 mg and will take additional 40 mg if needed    [provider]  predniSONE (DELTASONE) 20 MG tablet Take 1 tablet (20 mg total) by mouth 2 (two) times daily with a meal. 03/30/21   Raylene Everts, MD  zolmitriptan (ZOMIG) 5 MG tablet Take 5 mg by mouth as needed. For migraine    [provider]    Family History Family History  Problem Relation Age of Onset   COPD Mother    Emphysema Mother    Hypertension Mother    Hyperlipidemia Mother    Cancer Father    Diabetes Father     Social History Social History   Tobacco Use   Smoking status: Never   Smokeless tobacco: Never  Vaping Use   Vaping Use: Never used  Substance Use  Topics   Alcohol use: Yes    Comment: occasionally   Drug use: No     Allergies   Cephalosporins, Keflex [cephalexin], Other, Septra [sulfamethoxazole-trimethoprim], Simvastatin, and Zetia [ezetimibe]   Review of Systems Review of Systems  Genitourinary:  Positive for dysuria.    Physical Exam Triage Vital  Signs ED Triage Vitals  Enc Vitals Group     BP 06/25/21 0926 140/85     Pulse Rate 06/25/21 0926 100     Resp 06/25/21 0926 18     Temp 06/25/21 0926 98.3 F (36.8 C)     Temp Source 06/25/21 0926 Oral     SpO2 06/25/21 0926 96 %     Weight 06/25/21 0920 195 lb (88.5 kg)     Height 06/25/21 0920 $RemoveBefor'5\' 5"'dKgfCVVNOPWA$  (1.651 m)     Head Circumference --      Peak Flow --      Pain Score 06/25/21 0919 7     Pain Loc --      Pain Edu? --      Excl. in [place]? --    No data found.  Updated Vital Signs BP 140/85 (BP Location: Left Arm)    Pulse 100    Temp 98.3 F (36.8 C) (Oral)    Resp 18    Ht $R'5\' 5"'ko$  (1.651 m)    Wt 195 lb (88.5 kg)    SpO2 96%    BMI 32.45 kg/m    Physical Exam Vitals and nursing note reviewed.  Constitutional:      Appearance: Normal appearance. She is obese.  HENT:     Head: Normocephalic and atraumatic.     Mouth/Throat:     Mouth: Mucous membranes are moist.     Pharynx: Oropharynx is clear.  Eyes:     Extraocular Movements: Extraocular movements intact.     Conjunctiva/sclera: Conjunctivae normal.     Pupils: Pupils are equal, round, and reactive to light.  Cardiovascular:     Rate and Rhythm: Normal rate and regular rhythm.     Pulses: Normal pulses.     Heart sounds: Normal heart sounds.  Pulmonary:     Effort: Pulmonary effort is normal.     Breath sounds: Normal breath sounds.  Abdominal:     Tenderness: There is no right CVA tenderness or left CVA tenderness.  Skin:    General: Skin is warm and dry.  Neurological:     General: No focal deficit present.     Mental Status: She is alert and oriented to person, place, and time.     UC  Treatments / Results  Labs (all labs ordered are listed, but only abnormal results are displayed) Labs Reviewed  POCT URINALYSIS DIP (MANUAL ENTRY) - Abnormal; Notable for the following components:      Result Value   Color, UA orange (*)    Blood, UA trace-intact (*)    Nitrite, UA Positive (*)    Leukocytes, UA Small (1+) (*)    All other components within normal limits    EKG   Radiology No results found.  Procedures Procedures (including critical care time)  Medications Ordered in UC Medications - No data to display  Initial Impression / Assessment and Plan / UC Course  I have reviewed the triage vital signs and the nursing notes.  Pertinent labs & imaging results that were available during my care of the patient were reviewed by me and considered in my medical decision making (see chart for details).     MDM: 1.  Acute cystitis with hematuria-Rx'd Cipro. Advised patient to take medication as directed with food to completion.  Encouraged patient to increase daily water intake while taking this medication.  Advised patient we will follow-up once urine culture results are obtained.  Patient discharged home, hemodynamically stable.  Final Clinical Impressions(s) / UC Diagnoses   Final diagnoses:  Acute cystitis with hematuria     Discharge Instructions      Advised patient to take medication as directed with food to completion.  Encouraged patient to increase daily water intake while taking this medication.  Advised patient we will follow-up once urine culture results are obtained.     ED Prescriptions     Medication Sig Dispense Auth. Provider   ciprofloxacin (CIPRO) 250 MG tablet Take 1 tablet (250 mg total) by mouth every 12 (twelve) hours. 10 tablet Eliezer Lofts, FNP      PDMP not reviewed this encounter.   Eliezer Lofts, [place] 06/25/21 [phone]

## 2021-06-25 NOTE — ED Triage Notes (Signed)
Pt presents to Urgent Care with c/o dysuria x approx one week. Also reports noticing blood in urine twice initially. Reports routinely taking Macrobid following intercourse, and she has been on this for 7 days now w/ no relief.

## 2021-06-25 NOTE — Telephone Encounter (Unsigned)
No TC. Attempt to order Urine culture following d/c from [facility] today.

## 2021-07-10 ENCOUNTER — Telehealth: Payer: Self-pay | Admitting: Emergency Medicine

## 2021-07-10 NOTE — Telephone Encounter (Signed)
Patient called at 3:06pm on 07/10/21 for updated results of her urine culture from visit on 06/25/21. She is following up with her PCP tomorrow. In reviewing patient chart, looks like there were issues with the ordering and completion of the urine culture thru [facility].The order was discontinued. I advised patient that her urine culture was not completed due to the problems with ordering, requisition and lab change over. She is understandable and advised she could come here to leave a urine specimen to send off for urine culture or follow up with PCP tomorrow to make sure there is no sign of infection.
